# Patient Record
Sex: Male | Born: 1980 | Race: White | Hispanic: No | Marital: Married | Smoking: Never smoker
Health system: Southern US, Community
[De-identification: ages and names within clinical notes are randomized; demographics above are authoritative.]

## PROBLEM LIST (undated history)

## (undated) DIAGNOSIS — R030 Elevated blood-pressure reading, without diagnosis of hypertension: Secondary | ICD-10-CM

## (undated) DIAGNOSIS — B019 Varicella without complication: Secondary | ICD-10-CM

## (undated) HISTORY — DX: Elevated blood-pressure reading, without diagnosis of hypertension: R03.0

## (undated) HISTORY — DX: Varicella without complication: B01.9

---
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
## 2003-04-15 ENCOUNTER — Other Ambulatory Visit: Payer: Self-pay

## 2008-02-10 ENCOUNTER — Emergency Department: Payer: Self-pay | Admitting: Internal Medicine

## 2016-11-15 ENCOUNTER — Ambulatory Visit (INDEPENDENT_AMBULATORY_CARE_PROVIDER_SITE_OTHER): Payer: Managed Care, Other (non HMO) | Admitting: Primary Care

## 2016-11-15 ENCOUNTER — Encounter: Payer: Self-pay | Admitting: Primary Care

## 2016-11-15 VITALS — BP 120/98 | HR 70 | Temp 98.0°F | Ht 72.5 in | Wt 257.4 lb

## 2016-11-15 DIAGNOSIS — E785 Hyperlipidemia, unspecified: Secondary | ICD-10-CM | POA: Diagnosis not present

## 2016-11-15 DIAGNOSIS — R739 Hyperglycemia, unspecified: Secondary | ICD-10-CM | POA: Diagnosis not present

## 2016-11-15 DIAGNOSIS — R030 Elevated blood-pressure reading, without diagnosis of hypertension: Secondary | ICD-10-CM

## 2016-11-15 LAB — BASIC METABOLIC PANEL
BUN: 20 mg/dL (ref 6–23)
CO2: 27 mEq/L (ref 19–32)
Calcium: 9.6 mg/dL (ref 8.4–10.5)
Chloride: 104 mEq/L (ref 96–112)
Creatinine, Ser: 0.97 mg/dL (ref 0.40–1.50)
GFR: 93.56 mL/min (ref >60.00)
Glucose, Bld: 115 mg/dL — ABNORMAL HIGH (ref 70–99)
Potassium: 4.2 mEq/L (ref 3.5–5.1)
Sodium: 138 mEq/L (ref 135–145)

## 2016-11-15 LAB — HEMOGLOBIN A1C: Hgb A1c MFr Bld: 5.7 % (ref 4.6–6.5)

## 2016-11-15 NOTE — Assessment & Plan Note (Signed)
Diastolic reading above goal, even on recheck. Will have him start monitoring his blood pressure regularly, report readings at or above 135/90. Also work on weight loss through healthy diet and regular exercise. Will reevaluate in 3 months.

## 2016-11-15 NOTE — Assessment & Plan Note (Signed)
Fasting blood sugar of 114 on recent labs. A1c pending today. Discussed the importance of a healthy diet and regular exercise in order for weight loss, and to reduce the risk of other medical problems.

## 2016-11-15 NOTE — Patient Instructions (Signed)
Start monitoring your blood pressure, it should be between 120-130/70's-80's.   Complete lab work prior to leaving today. I will notify you of your results once received.   Start exercising. You should be getting 150 minutes of moderate intensity exercise weekly.  Increase consumption of vegetables, fruit, whole grains. Limit fast food.  Schedule a follow up visit in 3 months to recheck blood pressure and cholesterol. No eating for 8 hours prior to this visit. You may have water and black coffee.  It was a pleasure to meet you today! Please don't hesitate to call me with any questions. Welcome to [facility]!

## 2016-11-15 NOTE — Progress Notes (Signed)
   Subjective:    Patient ID: Ernest Schneider, male    DOB: 02-08-1981, 36 y.o.   MRN: [id number]  HPI  Ernest Schneider is a 36 year old male who presents today to establish care and discuss the problems mentioned below. Will obtain old records. Recently underwent biometric screening at work and has abnormal labs to discuss.  1) Hyperlipidemia: TC of 242, Trigs of 389, LDL of 119. Denies prior history of hyperlipidemia. He was fasting for these labs.  Diet currently consists of:  Breakfast: Skips Lunch: Packed lunch with sandwich; fast food mostly Dinner: Meat, vegetable, starch Snacks: Crackers, nuts Desserts: None Beverages: Water, Gatorade, little soda  Exercise: He does not currently exercise.    2) Hyperglycemia: Fasting blood sugar of 114. He denies prior history and family history of hyperglycemia and diabetes. He denies polyuria, polydipsia, polyphagia.  3) Elevated Blood Pressure Reading: BP in the office today of 122/98. He endorses an elevated reading at his pre-employee physical of 148/100. He does not routinely monitor his blood pressure. He denies chest pain, dizziness, headaches. He does have a family history of hypertension in his mother.  Review of Systems  Eyes: Negative for visual disturbance.  Respiratory: Negative for shortness of breath.   Cardiovascular: Negative for chest pain.  Endocrine: Negative for polydipsia, polyphagia and polyuria.  Neurological: Negative for dizziness and headaches.       Past Medical History:  Diagnosis Date  . Chickenpox   . Elevated blood pressure reading      Social History   Social History  . Marital status: Single    Spouse name: N/A  . Number of children: N/A  . Years of education: N/A   Occupational History  . Not on file.   Social History Main Topics  . Smoking status: Never Smoker  . Smokeless tobacco: Never Used  . Alcohol use Yes  . Drug use: Unknown  . Sexual activity: Not on file   Other Topics  Concern  . Not on file   Social History Narrative   Married.   3 children.   Works for the [employer].   Enjoys playing disc golf, riding 4-wheelers.     No past surgical history on file.  Family History  Problem Relation Age of Onset  . Depression Mother   . Heart attack Mother   . Miscarriages / [place] Mother   . Hypertension Mother   . Heart attack Paternal Grandfather     No Known Allergies  No current outpatient prescriptions on file prior to visit.   No current facility-administered medications on file prior to visit.     BP (!) 120/98   Pulse 70   Temp 98 F (36.7 C) (Oral)   Ht 6' 0.5" (1.842 m)   Wt 257 lb 6.4 oz (116.8 kg)   SpO2 98%   BMI 34.43 kg/m    Objective:   Physical Exam  Constitutional: He is oriented to person, place, and time. He appears well-nourished.  Neck: Neck supple.  Cardiovascular: Normal rate and regular rhythm.   Pulmonary/Chest: Effort normal and breath sounds normal. He has no wheezes. He has no rales.  Neurological: He is alert and oriented to person, place, and time.  Skin: Skin is warm and dry.  Psychiatric: He has a normal mood and affect.          Assessment & Plan:

## 2016-11-15 NOTE — Assessment & Plan Note (Signed)
Recent fasting labs above goal. Encouraged him to work on weight loss through healthy diet and regular exercise. We'll repeat lipids fasting in 3 months, this will give him time to work on weight loss.

## 2016-11-19 ENCOUNTER — Telehealth: Payer: Self-pay | Admitting: Primary Care

## 2016-11-19 NOTE — Telephone Encounter (Signed)
Patient was seen on 11/15/16.  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 Patient asked Jae Dire to send the office note to [employer] [facility]. The fax number is [phone]. Patient said they haven't received the office note. Patient was calling to make sure the note was faxed and if not, for it to be faxed.

## 2016-11-19 NOTE — Telephone Encounter (Signed)
Spoken to patient and I have notified patient I have faxed the letter to the [facility] [employer] [place] on 11/16/2016.  I will re-fax the letter again to [employer] [facility] [employer] [place] [place] Attn: Yehuda Budd, FNP-BC  Fax number is [phone]  Also mailing a copy of this letter for patient.

## 2018-12-12 ENCOUNTER — Emergency Department
Admission: EM | Admit: 2018-12-12 | Discharge: 2018-12-12 | Disposition: A | Discharge status: Home / Self Care | Payer: 59 | Attending: Emergency Medicine | Admitting: Emergency Medicine

## 2018-12-12 ENCOUNTER — Other Ambulatory Visit: Payer: Self-pay

## 2018-12-12 ENCOUNTER — Emergency Department: Payer: 59

## 2018-12-12 ENCOUNTER — Encounter: Payer: Self-pay | Admitting: Emergency Medicine

## 2018-12-12 DIAGNOSIS — R079 Chest pain, unspecified: Secondary | ICD-10-CM | POA: Diagnosis present

## 2018-12-12 DIAGNOSIS — R112 Nausea with vomiting, unspecified: Secondary | ICD-10-CM | POA: Insufficient documentation

## 2018-12-12 LAB — CBC
HCT: 46.1 % (ref 39.0–52.0)
Hemoglobin: 15.7 g/dL (ref 13.0–17.0)
MCH: 29 pg (ref 26.0–34.0)
MCHC: 34.1 g/dL (ref 30.0–36.0)
MCV: 85.2 fL (ref 80.0–100.0)
Platelets: 227 10*3/uL (ref 150–400)
RBC: 5.41 MIL/uL (ref 4.22–5.81)
RDW: 12.2 % (ref 11.5–15.5)
WBC: 12.3 10*3/uL — ABNORMAL HIGH (ref 4.0–10.5)
nRBC: 0 % (ref 0.0–0.2)

## 2018-12-12 LAB — BASIC METABOLIC PANEL
Anion gap: 9 (ref 5–15)
BUN: 17 mg/dL (ref 6–20)
CO2: 26 mmol/L (ref 22–32)
Calcium: 9.4 mg/dL (ref 8.9–10.3)
Chloride: 104 mmol/L (ref 98–111)
Creatinine, Ser: 1.05 mg/dL (ref 0.61–1.24)
GFR calc Af Amer: >60 mL/min (ref >60)
GFR calc non Af Amer: >60 mL/min (ref >60)
Glucose, Bld: 123 mg/dL — ABNORMAL HIGH (ref 70–99)
Potassium: 3.9 mmol/L (ref 3.5–5.1)
Sodium: 139 mmol/L (ref 135–145)

## 2018-12-12 LAB — TROPONIN I (HIGH SENSITIVITY)
Troponin I (High Sensitivity): 3 ng/L (ref <18)
Troponin I (High Sensitivity): 3 ng/L (ref <18)

## 2018-12-12 MED ORDER — ONDANSETRON 4 MG PO TBDP
4.0000 mg | ORAL_TABLET | Freq: Once | ORAL | Status: AC
  Administered 2018-12-12: 4 mg via ORAL
  Filled 2018-12-12: qty 1

## 2018-12-12 MED ORDER — ONDANSETRON 4 MG PO TBDP: 4.0000 mg | ORAL_TABLET | Freq: Three times a day (TID) | ORAL | 0 refills | Status: DC | PRN

## 2018-12-12 MED ORDER — FAMOTIDINE 20 MG PO TABS: 20.0000 mg | ORAL_TABLET | Freq: Two times a day (BID) | ORAL | 1 refills | Status: DC

## 2018-12-12 MED ORDER — FAMOTIDINE 20 MG PO TABS
20.0000 mg | ORAL_TABLET | Freq: Once | ORAL | Status: AC
  Administered 2018-12-12: 20 mg via ORAL
  Filled 2018-12-12: qty 1

## 2018-12-12 MED ORDER — SODIUM CHLORIDE 0.9% FLUSH: 3.0000 mL | Freq: Once | INTRAVENOUS | Status: DC

## 2018-12-12 NOTE — ED Provider Notes (Signed)
[facility] Emergency Department Provider Note       Time seen: ----------------------------------------- 11:34 AM on 12/12/2018 -----------------------------------------   I have reviewed the triage vital signs and the nursing notes.  HISTORY   Chief Complaint Chest Pain    HPI Ernest Schneider is a 38 y.o. male with a history of hypertension, hyperglycemia, hyperlipidemia who presents to the ED for intermittent chest pain since last night.  Patient also had some nausea and vomiting this morning.  Discomfort is 2 out of 10.  Past Medical History:  Diagnosis Date  . Chickenpox   . Elevated blood pressure reading     Patient Active Problem List   Diagnosis Date Noted  . Hyperglycemia 11/15/2016  . Hyperlipidemia 11/15/2016  . Elevated blood pressure reading 11/15/2016    History reviewed. No pertinent surgical history.  Allergies Patient has no known allergies.  Social History Social History   Tobacco Use  . Smoking status: Never Smoker  . Smokeless tobacco: Never Used  Substance Use Topics  . Alcohol use: Yes  . Drug use: Not on file   Review of Systems Constitutional: Negative for fever. Cardiovascular: Positive for chest pain Respiratory: Negative for shortness of breath. Gastrointestinal: Negative for abdominal pain, positive for nausea vomiting Musculoskeletal: Negative for back pain. Skin: Negative for rash. Neurological: Negative for headaches, focal weakness or numbness.  All systems negative/normal/unremarkable except as stated in the HPI  ____________________________________________   PHYSICAL EXAM:  VITAL SIGNS: ED Triage Vitals  Enc Vitals Group     BP 12/12/18 1019 (!) 145/96     Pulse Rate 12/12/18 1019 72     Resp 12/12/18 1019 20     Temp 12/12/18 1019 98 F (36.7 C)     Temp Source 12/12/18 1019 Oral     SpO2 12/12/18 1019 97 %     Weight 12/12/18 1017 255 lb (115.7 kg)     Height 12/12/18 1017 6\' 1"   (1.854 m)     Head Circumference --      Peak Flow --      Pain Score 12/12/18 1017 2     Pain Loc --      Pain Edu? --      Excl. in GC? --    Constitutional: Alert and oriented. Well appearing and in no distress. Eyes: Conjunctivae are normal. Normal extraocular movements. ENT      Head: Normocephalic and atraumatic.      Nose: No congestion/rhinnorhea.      Mouth/Throat: Mucous membranes are moist.      Neck: No stridor. Cardiovascular: Normal rate, regular rhythm. No murmurs, rubs, or gallops. Respiratory: Normal respiratory effort without tachypnea nor retractions. Breath sounds are clear and equal bilaterally. No wheezes/rales/rhonchi. Gastrointestinal: Soft and nontender. Normal bowel sounds Musculoskeletal: Nontender with normal range of motion in extremities. No lower extremity tenderness nor edema. Neurologic:  Normal speech and language. No gross focal neurologic deficits are appreciated.  Skin:  Skin is warm, dry and intact. No rash noted. Psychiatric: Mood and affect are normal. Speech and behavior are normal.  ____________________________________________  EKG: Interpreted by me.  Sinus rhythm with a rate of 77 bpm, left axis deviation, LVH, normal QT  ____________________________________________  ED COURSE:  As part of my medical decision making, I reviewed the following data within the electronic MEDICAL RECORD NUMBER History obtained from family if available, nursing notes, old chart and ekg, as well as notes from prior ED visits. Patient presented for chest pain,  we will assess with labs and imaging as indicated at this time.   Procedures  Ernest Schneider was evaluated in Emergency Department on 12/12/2018 for the symptoms described in the history of present illness. He was evaluated in the context of the global COVID-19 pandemic, which necessitated consideration that the patient might be at risk for infection with the SARS-CoV-2 virus that causes COVID-19.  Institutional protocols and algorithms that pertain to the evaluation of patients at risk for COVID-19 are in a state of rapid change based on information released by regulatory bodies including the CDC and federal and state organizations. These policies and algorithms were followed during the patient's care in the ED.  ____________________________________________   LABS (pertinent positives/negatives)  Labs Reviewed  BASIC METABOLIC PANEL - Abnormal; Notable for the following components:      Result Value   Glucose, Bld 123 (*)    All other components within normal limits  CBC - Abnormal; Notable for the following components:   WBC 12.3 (*)    All other components within normal limits  TROPONIN I (HIGH SENSITIVITY)    RADIOLOGY  Chest x-ray is normal  ____________________________________________   DIFFERENTIAL DIAGNOSIS   Musculoskeletal pain, GERD, anxiety, unstable angina, PE  FINAL ASSESSMENT AND PLAN  Chest pain   Plan: The patient had presented for nonspecific chest pain. Patient's labs revealed mild leukocytosis without other acute process. Patient's imaging was negative.  He appears medically cleared for outpatient follow-up, is low risk for ACS.   Laurence Aly, MD    Note: This note was generated in part or whole with voice recognition software. Voice recognition is usually quite accurate but there are transcription errors that can and very often do occur. I apologize for any typographical errors that were not detected and corrected.     Earleen Newport, MD 12/12/18 [phone]

## 2018-12-12 NOTE — ED Triage Notes (Signed)
Presents with intermittent chest pain since last pm also has had some n/v this am

## 2019-09-14 ENCOUNTER — Other Ambulatory Visit: Payer: Self-pay

## 2019-09-14 ENCOUNTER — Emergency Department: Payer: 59

## 2019-09-14 DIAGNOSIS — R0602 Shortness of breath: Secondary | ICD-10-CM | POA: Diagnosis not present

## 2019-09-14 DIAGNOSIS — U071 COVID-19: Secondary | ICD-10-CM | POA: Diagnosis not present

## 2019-09-14 DIAGNOSIS — J1282 Pneumonia due to coronavirus disease 2019: Secondary | ICD-10-CM | POA: Diagnosis present

## 2019-09-14 DIAGNOSIS — Z818 Family history of other mental and behavioral disorders: Secondary | ICD-10-CM

## 2019-09-14 DIAGNOSIS — J9601 Acute respiratory failure with hypoxia: Secondary | ICD-10-CM | POA: Diagnosis present

## 2019-09-14 DIAGNOSIS — Z8249 Family history of ischemic heart disease and other diseases of the circulatory system: Secondary | ICD-10-CM

## 2019-09-14 LAB — CBC WITH DIFFERENTIAL/PLATELET
Abs Immature Granulocytes: 0.02 10*3/uL (ref 0.00–0.07)
Basophils Absolute: 0 10*3/uL (ref 0.0–0.1)
Basophils Relative: 0 %
Eosinophils Absolute: 0 10*3/uL (ref 0.0–0.5)
Eosinophils Relative: 0 %
HCT: 42.7 % (ref 39.0–52.0)
Hemoglobin: 14.9 g/dL (ref 13.0–17.0)
Immature Granulocytes: 0 %
Lymphocytes Relative: 28 %
Lymphs Abs: 2.5 10*3/uL (ref 0.7–4.0)
MCH: 29.4 pg (ref 26.0–34.0)
MCHC: 34.9 g/dL (ref 30.0–36.0)
MCV: 84.4 fL (ref 80.0–100.0)
Monocytes Absolute: 0.5 10*3/uL (ref 0.1–1.0)
Monocytes Relative: 6 %
Neutro Abs: 5.8 10*3/uL (ref 1.7–7.7)
Neutrophils Relative %: 66 %
Platelets: 124 10*3/uL — ABNORMAL LOW (ref 150–400)
RBC: 5.06 MIL/uL (ref 4.22–5.81)
RDW: 12.1 % (ref 11.5–15.5)
WBC: 8.8 10*3/uL (ref 4.0–10.5)
nRBC: 0 % (ref 0.0–0.2)

## 2019-09-14 LAB — COMPREHENSIVE METABOLIC PANEL
ALT: 19 U/L (ref 0–44)
AST: 35 U/L (ref 15–41)
Albumin: 4 g/dL (ref 3.5–5.0)
Alkaline Phosphatase: 87 U/L (ref 38–126)
Anion gap: 15 (ref 5–15)
BUN: 11 mg/dL (ref 6–20)
CO2: 24 mmol/L (ref 22–32)
Calcium: 8.6 mg/dL — ABNORMAL LOW (ref 8.9–10.3)
Chloride: 97 mmol/L — ABNORMAL LOW (ref 98–111)
Creatinine, Ser: 1.21 mg/dL (ref 0.61–1.24)
GFR calc Af Amer: >60 mL/min (ref >60)
GFR calc non Af Amer: >60 mL/min (ref >60)
Glucose, Bld: 117 mg/dL — ABNORMAL HIGH (ref 70–99)
Potassium: 3.5 mmol/L (ref 3.5–5.1)
Sodium: 136 mmol/L (ref 135–145)
Total Bilirubin: 0.9 mg/dL (ref 0.3–1.2)
Total Protein: 7.9 g/dL (ref 6.5–8.1)

## 2019-09-14 LAB — PROTIME-INR
INR: 0.9 (ref 0.8–1.2)
Prothrombin Time: 12.2 seconds (ref 11.4–15.2)

## 2019-09-14 LAB — LACTIC ACID, PLASMA: Lactic Acid, Venous: 1.1 mmol/L (ref 0.5–1.9)

## 2019-09-14 MED ORDER — ACETAMINOPHEN 325 MG PO TABS
650.0000 mg | ORAL_TABLET | Freq: Once | ORAL | Status: AC
  Administered 2019-09-14: 650 mg via ORAL

## 2019-09-14 MED ORDER — SODIUM CHLORIDE 0.9 % IV BOLUS
1000.0000 mL | Freq: Once | INTRAVENOUS | Status: AC
  Administered 2019-09-14: 1000 mL via INTRAVENOUS

## 2019-09-14 NOTE — ED Notes (Signed)
Patient placed on 2L [place].

## 2019-09-14 NOTE — ED Triage Notes (Signed)
Patient dx COVID on Wednesday. Patient c/o SOB, fever, malaise.

## 2019-09-15 ENCOUNTER — Inpatient Hospital Stay
Admission: EM | Admit: 2019-09-15 | Discharge: 2019-09-21 | DRG: 177 | Disposition: A | Discharge status: Home / Self Care | Payer: 59 | Attending: Hospitalist | Admitting: Hospitalist

## 2019-09-15 ENCOUNTER — Emergency Department: Payer: 59

## 2019-09-15 ENCOUNTER — Encounter: Payer: Self-pay | Admitting: Internal Medicine

## 2019-09-15 DIAGNOSIS — J1282 Pneumonia due to coronavirus disease 2019: Secondary | ICD-10-CM | POA: Diagnosis present

## 2019-09-15 DIAGNOSIS — J189 Pneumonia, unspecified organism: Secondary | ICD-10-CM

## 2019-09-15 DIAGNOSIS — J9600 Acute respiratory failure, unspecified whether with hypoxia or hypercapnia: Secondary | ICD-10-CM | POA: Diagnosis not present

## 2019-09-15 DIAGNOSIS — U071 COVID-19: Secondary | ICD-10-CM | POA: Diagnosis present

## 2019-09-15 DIAGNOSIS — J9601 Acute respiratory failure with hypoxia: Secondary | ICD-10-CM | POA: Diagnosis present

## 2019-09-15 DIAGNOSIS — Z8249 Family history of ischemic heart disease and other diseases of the circulatory system: Secondary | ICD-10-CM | POA: Diagnosis not present

## 2019-09-15 DIAGNOSIS — R0602 Shortness of breath: Secondary | ICD-10-CM | POA: Diagnosis present

## 2019-09-15 DIAGNOSIS — J22 Unspecified acute lower respiratory infection: Secondary | ICD-10-CM

## 2019-09-15 DIAGNOSIS — Z818 Family history of other mental and behavioral disorders: Secondary | ICD-10-CM | POA: Diagnosis not present

## 2019-09-15 DIAGNOSIS — A419 Sepsis, unspecified organism: Secondary | ICD-10-CM

## 2019-09-15 LAB — CREATININE, SERUM
Creatinine, Ser: 1.02 mg/dL (ref 0.61–1.24)
GFR calc Af Amer: >60 mL/min (ref >60)
GFR calc non Af Amer: >60 mL/min (ref >60)

## 2019-09-15 LAB — PROCALCITONIN: Procalcitonin: 0.24 ng/mL

## 2019-09-15 LAB — URINALYSIS, COMPLETE (UACMP) WITH MICROSCOPIC
Bacteria, UA: NONE SEEN
Bilirubin Urine: NEGATIVE
Glucose, UA: 50 mg/dL — AB
Hgb urine dipstick: NEGATIVE
Ketones, ur: 20 mg/dL — AB
Leukocytes,Ua: NEGATIVE
Nitrite: NEGATIVE
Protein, ur: 100 mg/dL — AB
Specific Gravity, Urine: 1.031 — ABNORMAL HIGH (ref 1.005–1.030)
pH: 6 (ref 5.0–8.0)

## 2019-09-15 LAB — TROPONIN I (HIGH SENSITIVITY): Troponin I (High Sensitivity): 17 ng/L (ref <18)

## 2019-09-15 LAB — CBC
HCT: 42 % (ref 39.0–52.0)
Hemoglobin: 14.4 g/dL (ref 13.0–17.0)
MCH: 29.3 pg (ref 26.0–34.0)
MCHC: 34.3 g/dL (ref 30.0–36.0)
MCV: 85.4 fL (ref 80.0–100.0)
Platelets: 123 10*3/uL — ABNORMAL LOW (ref 150–400)
RBC: 4.92 MIL/uL (ref 4.22–5.81)
RDW: 12.4 % (ref 11.5–15.5)
WBC: 5.1 10*3/uL (ref 4.0–10.5)
nRBC: 0 % (ref 0.0–0.2)

## 2019-09-15 LAB — BRAIN NATRIURETIC PEPTIDE: B Natriuretic Peptide: 30.6 pg/mL (ref 0.0–100.0)

## 2019-09-15 LAB — ABO/RH: ABO/RH(D): O POS

## 2019-09-15 LAB — FIBRIN DERIVATIVES D-DIMER (ARMC ONLY): Fibrin derivatives D-dimer (ARMC): 720.35 ng/mL (FEU) — ABNORMAL HIGH (ref 0.00–499.00)

## 2019-09-15 LAB — LACTATE DEHYDROGENASE: LDH: 234 U/L — ABNORMAL HIGH (ref 98–192)

## 2019-09-15 LAB — C-REACTIVE PROTEIN: CRP: 11 mg/dL — ABNORMAL HIGH (ref <1.0)

## 2019-09-15 LAB — FERRITIN: Ferritin: 623 ng/mL — ABNORMAL HIGH (ref 24–336)

## 2019-09-15 LAB — TRIGLYCERIDES: Triglycerides: 118 mg/dL (ref <150)

## 2019-09-15 LAB — FIBRINOGEN: Fibrinogen: 555 mg/dL — ABNORMAL HIGH (ref 210–475)

## 2019-09-15 MED ORDER — ONDANSETRON HCL 4 MG PO TABS: 4.0000 mg | ORAL_TABLET | Freq: Four times a day (QID) | ORAL | Status: DC | PRN

## 2019-09-15 MED ORDER — SODIUM CHLORIDE 0.9% FLUSH: 3.0000 mL | INTRAVENOUS | Status: DC | PRN

## 2019-09-15 MED ORDER — DEXAMETHASONE SODIUM PHOSPHATE 10 MG/ML IJ SOLN
10.0000 mg | Freq: Once | INTRAMUSCULAR | Status: AC
  Administered 2019-09-15: 10 mg via INTRAVENOUS
  Filled 2019-09-15: qty 1

## 2019-09-15 MED ORDER — GUAIFENESIN-DM 100-10 MG/5ML PO SYRP
10.0000 mL | ORAL_SOLUTION | ORAL | Status: DC | PRN
  Administered 2019-09-16 – 2019-09-18 (×6): 10 mL via ORAL
  Filled 2019-09-15 (×8): qty 10

## 2019-09-15 MED ORDER — HYDROCOD POLST-CPM POLST ER 10-8 MG/5ML PO SUER
5.0000 mL | Freq: Two times a day (BID) | ORAL | Status: DC | PRN
  Administered 2019-09-18 – 2019-09-19 (×2): 5 mL via ORAL
  Filled 2019-09-15 (×3): qty 5

## 2019-09-15 MED ORDER — ALBUTEROL SULFATE HFA 108 (90 BASE) MCG/ACT IN AERS
2.0000 | INHALATION_SPRAY | Freq: Four times a day (QID) | RESPIRATORY_TRACT | Status: DC
  Administered 2019-09-15 – 2019-09-21 (×21): 2 via RESPIRATORY_TRACT
  Filled 2019-09-15: qty 6.7

## 2019-09-15 MED ORDER — SODIUM CHLORIDE 0.9 % IV SOLN
100.0000 mg | Freq: Every day | INTRAVENOUS | Status: AC
  Administered 2019-09-16 – 2019-09-19 (×4): 100 mg via INTRAVENOUS
  Filled 2019-09-15 (×4): qty 100

## 2019-09-15 MED ORDER — ONDANSETRON HCL 4 MG/2ML IJ SOLN: 4.0000 mg | Freq: Four times a day (QID) | INTRAMUSCULAR | Status: DC | PRN

## 2019-09-15 MED ORDER — ASCORBIC ACID 500 MG PO TABS
500.0000 mg | ORAL_TABLET | Freq: Every day | ORAL | Status: DC
  Administered 2019-09-15 – 2019-09-21 (×5): 500 mg via ORAL
  Filled 2019-09-15 (×6): qty 1

## 2019-09-15 MED ORDER — SODIUM CHLORIDE 0.9 % IV SOLN
200.0000 mg | Freq: Once | INTRAVENOUS | Status: AC
  Administered 2019-09-15: 10:00:00 200 mg via INTRAVENOUS
  Filled 2019-09-15: qty 40

## 2019-09-15 MED ORDER — SODIUM CHLORIDE 0.9 % IV SOLN
250.0000 mL | INTRAVENOUS | Status: DC | PRN
  Administered 2019-09-16 – 2019-09-17 (×2): 250 mL via INTRAVENOUS

## 2019-09-15 MED ORDER — ENOXAPARIN SODIUM 40 MG/0.4ML ~~LOC~~ SOLN
40.0000 mg | SUBCUTANEOUS | Status: DC
  Administered 2019-09-15 – 2019-09-21 (×7): 40 mg via SUBCUTANEOUS
  Filled 2019-09-15 (×7): qty 0.4

## 2019-09-15 MED ORDER — IBUPROFEN 100 MG/5ML PO SUSP: 800.0000 mg | Freq: Once | ORAL | Status: DC

## 2019-09-15 MED ORDER — ORAL CARE MOUTH RINSE
15.0000 mL | Freq: Two times a day (BID) | OROMUCOSAL | Status: DC
  Administered 2019-09-15 – 2019-09-21 (×11): 15 mL via OROMUCOSAL

## 2019-09-15 MED ORDER — DEXAMETHASONE SODIUM PHOSPHATE 10 MG/ML IJ SOLN
6.0000 mg | INTRAMUSCULAR | Status: DC
  Administered 2019-09-16: 09:00:00 6 mg via INTRAVENOUS
  Filled 2019-09-15: qty 1

## 2019-09-15 MED ORDER — IBUPROFEN 800 MG PO TABS
800.0000 mg | ORAL_TABLET | Freq: Once | ORAL | Status: AC
  Administered 2019-09-15: 800 mg via ORAL

## 2019-09-15 MED ORDER — ZINC SULFATE 220 (50 ZN) MG PO CAPS
220.0000 mg | ORAL_CAPSULE | Freq: Every day | ORAL | Status: DC
  Administered 2019-09-15 – 2019-09-21 (×5): 220 mg via ORAL
  Filled 2019-09-15 (×6): qty 1

## 2019-09-15 MED ORDER — ACETAMINOPHEN 325 MG PO TABS
650.0000 mg | ORAL_TABLET | Freq: Four times a day (QID) | ORAL | Status: DC | PRN
  Administered 2019-09-15 – 2019-09-17 (×6): 650 mg via ORAL
  Filled 2019-09-15 (×6): qty 2

## 2019-09-15 MED ORDER — SODIUM CHLORIDE 0.9% FLUSH
3.0000 mL | Freq: Two times a day (BID) | INTRAVENOUS | Status: DC
  Administered 2019-09-15 – 2019-09-21 (×12): 3 mL via INTRAVENOUS

## 2019-09-15 MED ORDER — ADULT MULTIVITAMIN W/MINERALS CH
1.0000 | ORAL_TABLET | Freq: Every day | ORAL | Status: DC
  Administered 2019-09-15 – 2019-09-21 (×5): 1 via ORAL
  Filled 2019-09-15 (×6): qty 1

## 2019-09-15 NOTE — ED Notes (Signed)
ED Provider at bedside. 

## 2019-09-15 NOTE — ED Provider Notes (Signed)
[facility] Emergency Department Provider Note  ____________________________________________   First MD Initiated Contact with Patient 09/15/19 [phone]     (approximate)  I have reviewed the triage vital signs and the nursing notes.   HISTORY  Chief Complaint Fever    HPI Ernest Schneider is a 39 y.o. male with no chronic medical history other than obesity who presents for evaluation of about 5 days of worsening fever, shortness of breath, cough, generalized body aches.  He went to an urgent care about 4 days ago and had testing performed including COVID-19.  He tested positive and received the results 3 days ago.  Since that time his symptoms have steadily gotten worse.  He reports that any amount of exertion makes the shortness of breath and cough worse.  He says he has not been able to control well his fever even with over-the-counter remedies.  He has not had much appetite and does not have much of a sense of smell or taste so he is not eating or drinking well.  Nothing in particular makes his symptoms better and they are severe.  He has some chest pain when he coughs but only at that time.  He has no smoking history.  He did not receive the Covid vaccination.         Past Medical History:  Diagnosis Date  . Chickenpox   . Elevated blood pressure reading     Patient Active Problem List   Diagnosis Date Noted  . Pneumonia due to COVID-19 virus 09/15/2019  . Acute respiratory failure due to COVID-19 (HCC) 09/15/2019  . Hyperglycemia 11/15/2016  . Hyperlipidemia 11/15/2016  . Elevated blood pressure reading 11/15/2016    History reviewed. No pertinent surgical history.  Prior to Admission medications   Medication Sig Start Date End Date Taking? Authorizing Provider  famotidine (PEPCID) 20 MG tablet Take 1 tablet (20 mg total) by mouth 2 (two) times daily. 12/12/18   Emily Filbert, MD  ondansetron (ZOFRAN ODT) 4 MG disintegrating tablet Take 1  tablet (4 mg total) by mouth every 8 (eight) hours as needed for nausea or vomiting. 12/12/18   Emily Filbert, MD    Allergies Patient has no known allergies.  Family History  Problem Relation Age of Onset  . Depression Mother   . Heart attack Mother   . Miscarriages / [place] Mother   . Hypertension Mother   . Heart attack Paternal Grandfather     Social History Social History   Tobacco Use  . Smoking status: Never Smoker  . Smokeless tobacco: Never Used  Substance Use Topics  . Alcohol use: Yes  . Drug use: Not on file    Review of Systems Constitutional: +fever/chills, general malaise Eyes: No visual changes. ENT: +sore throat. Cardiovascular: Denies chest pain. Respiratory: + Shortness of breath and cough. Gastrointestinal: No abdominal pain.  No nausea, no vomiting.  No diarrhea.  No constipation. Genitourinary: Negative for dysuria. Musculoskeletal: Negative for neck pain.  Negative for back pain. Integumentary: Negative for rash. Neurological: Negative for headaches, focal weakness or numbness.   ____________________________________________   PHYSICAL EXAM:  VITAL SIGNS: ED Triage Vitals  Enc Vitals Group     BP 09/14/19 2252 (!) 156/102     Pulse Rate 09/14/19 2252 (!) 115     Resp 09/14/19 2252 (!) 24     Temp 09/14/19 2252 (!) 102.9 F (39.4 C)     Temp Source 09/15/19 0334 Oral  SpO2 09/14/19 2252 93 %     Weight 09/14/19 2252 (!) 115.7 kg (255 lb 1.2 oz)     Height 09/14/19 2252 1.854 m (6\' 1" )     Head Circumference --      Peak Flow --      Pain Score 09/14/19 2252 10     Pain Loc --      Pain Edu? --      Excl. in GC? --     Constitutional: Alert and oriented.  Eyes: Conjunctivae are normal.  Head: Atraumatic. Nose: No congestion/rhinnorhea. Mouth/Throat: Patient is wearing a mask. Neck: No stridor.  No meningeal signs.   Cardiovascular: Tachycardia, regular rhythm. Good peripheral circulation. Grossly normal heart  sounds. Respiratory: Increased respiratory rate.  No audible wheezing.  Frequent dry cough. Gastrointestinal: Soft and nontender. No distention.  Musculoskeletal: No lower extremity tenderness nor edema. No gross deformities of extremities. Neurologic:  Normal speech and language. No gross focal neurologic deficits are appreciated.  Skin:  Skin is warm, dry and intact. Psychiatric: Mood and affect are normal. Speech and behavior are normal.  ____________________________________________   LABS (all labs ordered are listed, but only abnormal results are displayed)  Labs Reviewed  COMPREHENSIVE METABOLIC PANEL - Abnormal; Notable for the following components:      Result Value   Chloride 97 (*)    Glucose, Bld 117 (*)    Calcium 8.6 (*)    All other components within normal limits  CBC WITH DIFFERENTIAL/PLATELET - Abnormal; Notable for the following components:   Platelets 124 (*)    All other components within normal limits  CULTURE, BLOOD (ROUTINE X 2)  CULTURE, BLOOD (ROUTINE X 2)  LACTIC ACID, PLASMA  PROTIME-INR  URINALYSIS, COMPLETE (UACMP) WITH MICROSCOPIC  FIBRIN DERIVATIVES D-DIMER ([facility] ONLY)  PROCALCITONIN  LACTATE DEHYDROGENASE  FERRITIN  TRIGLYCERIDES  FIBRINOGEN  C-REACTIVE PROTEIN  BRAIN NATRIURETIC PEPTIDE  TROPONIN I (HIGH SENSITIVITY)   ____________________________________________  EKG  ED ECG REPORT I, 09/16/19, the attending physician, personally viewed and interpreted this ECG.  Date: 09/14/2019 EKG Time: 22: 54 Rate: 102 Rhythm: Borderline sinus tachycardia QRS Axis: Left axis deviation Intervals: normal ST/T Wave abnormalities: normal Narrative Interpretation: no evidence of acute ischemia  ____________________________________________  RADIOLOGY I, 09/16/2019, personally viewed and evaluated these images (plain radiographs) as part of my medical decision making, as well as reviewing the written report by the radiologist.  ED MD  interpretation: Consistent with multifocal pneumonia  Official radiology report(s): DG Chest 1 View  Result Date: 09/15/2019 CLINICAL DATA:  COVID pneumonia EXAM: CHEST  1 VIEW COMPARISON:  12/12/2018 FINDINGS: There is scattered bilateral pulmonary opacities, greatest in the right mid lung zone. There is no pneumothorax. No large pleural effusion. The lung volumes are low. The cardiac silhouette is borderline enlarged. There is no acute osseous abnormality. IMPRESSION: Bilateral pulmonary opacities concerning for multifocal pneumonia (viral or bacterial). Electronically Signed   By: 12/14/2018 M.D.   On: 09/15/2019 03:44    ____________________________________________   PROCEDURES   Procedure(s) performed (including Critical Care):  .1-3 Lead EKG Interpretation Performed by: 09/17/2019, MD Authorized by: Loleta Rose, MD     Interpretation: abnormal     ECG rate:  105   ECG rate assessment: tachycardic     Rhythm: sinus tachycardia     Ectopy: none     Conduction: normal    .Critical Care Performed by: Loleta Rose, MD Authorized by: Loleta Rose, MD   Critical care  provider statement:    Critical care time (minutes):  30   Critical care time was exclusive of:  Separately billable procedures and treating other patients   Critical care was necessary to treat or prevent imminent or life-threatening deterioration of the following conditions:  Respiratory failure   Critical care was time spent personally by me on the following activities:  Development of treatment plan with patient or surrogate, discussions with consultants, evaluation of patient's response to treatment, examination of patient, obtaining history from patient or surrogate, ordering and performing treatments and interventions, ordering and review of laboratory studies, ordering and review of radiographic studies, pulse oximetry, re-evaluation of patient's condition and review of old  charts     ____________________________________________   INITIAL IMPRESSION / MDM / ASSESSMENT AND PLAN / ED COURSE  As part of my medical decision making, I reviewed the following data within the electronic MEDICAL RECORD NUMBER Nursing notes reviewed and incorporated, Labs reviewed , EKG interpreted , Old chart reviewed, Radiograph reviewed , Discussed with admitting physician (Dr. Para Marchuncan) and Notes from prior ED visits   Differential diagnosis includes, but is not limited to, COVID-19 lower respiratory tract infection, community-acquired pneumonia, PE.  The patient is on the cardiac monitor to evaluate for evidence of arrhythmia and/or significant heart rate changes.  The patient tested positive as an outpatient which I verified in the computer system ([employer]).  I reviewed his chest x-ray and he has multifocal pneumonia consistent with a COVID-19 infection.  He has mildly tachycardic and febrile which is also consistent in addition to some tachypnea.  His CBC is normal, comprehensive metabolic panel is normal, and a normal lactic acid.  The patient has oxygen saturation of anywhere between 89 and 98%.  His oxygenation seems to improve after cough.  I discussed it with the patient and we will try and ambulatory SPO2.  If he demonstrates that he needs oxygen when ambulating I will admit him to the hospital.  Otherwise I will treat him with prescriptions to help with his symptoms and refer him to the antibiotic infusion center.  He understands and agrees with the plan.  He received Tylenol and I ordered a dose of ibuprofen 600 mg by mouth as well.  He is able to tolerate oral fluids as per COVID recommendations I am not providing IV fluids at this time.     Clinical Course as of Sep 14 552  Sat Sep 15, 2019  0452 DG Chest 1 View [CF]  [id number] The patient dropped down to 87% with ambulation and his respiratory rate went up to the upper 40s and he had to stop multiple times due to the severity of  his cough.  I believe he would benefit from inpatient treatment.He is on 2 L of oxygen by nasal cannula.  I added on all of the COVID-19 inflammatory markers.  I ordered Decadron 10 mg IV and remdesivir per pharmacy consult.  I have consulted the hospitalist for admission.   [CF]  [id number] Discussed case with Dr. Para Marchuncan with the hospitalist service who will admit.   [CF]    Clinical Course User Index [CF] Loleta RoseForbach, Zedric Deroy, MD     ____________________________________________  FINAL CLINICAL IMPRESSION(S) / ED DIAGNOSES  Final diagnoses:  Lower respiratory tract infection due to COVID-19 virus  Acute respiratory failure with hypoxemia (HCC)  Multifocal pneumonia     MEDICATIONS GIVEN DURING THIS VISIT:  Medications  dexamethasone (DECADRON) injection 10 mg (has no administration in time range)  acetaminophen (TYLENOL)  tablet 650 mg (650 mg Oral Given 09/14/19 2303)  sodium chloride 0.9 % bolus 1,000 mL (0 mLs Intravenous Stopped 09/15/19 0331)  ibuprofen (ADVIL) tablet 800 mg (800 mg Oral Given 09/15/19 6962)     ED Discharge Orders    None      *Please note:  JONDAVID SCHREIER was evaluated in Emergency Department on 09/15/2019 for the symptoms described in the history of present illness. He was evaluated in the context of the global COVID-19 pandemic, which necessitated consideration that the patient might be at risk for infection with the SARS-CoV-2 virus that causes COVID-19. Institutional protocols and algorithms that pertain to the evaluation of patients at risk for COVID-19 are in a state of rapid change based on information released by regulatory bodies including the CDC and federal and state organizations. These policies and algorithms were followed during the patient's care in the ED.  Some ED evaluations and interventions may be delayed as a result of limited staffing during and after the pandemic.*  Note:  This document was prepared using Dragon voice recognition software and may  include unintentional dictation errors.   Loleta Rose, MD 09/15/19 [phone]

## 2019-09-15 NOTE — ED Notes (Signed)
Attempted to call report. Nurse Secretary for 1C stated that the receiving RN was in a patients room and would call me back.

## 2019-09-15 NOTE — H&P (Addendum)
History and Physical:    Ernest Schneider   [name] DOB: 06/04/1980 DOA: 09/15/2019  Referring MD/provider: Dr. York Cerise PCP: Patient, No Pcp Per   Patient coming from: Home  Chief Complaint: Malaise, anorexia, shortness of breath and fevers  History of Present Illness:   Ernest Schneider is an 39 y.o. male on no chronic medications who was in his usual state of health until 5 days prior to admission when he developed onset of fevers, malaise and anorexia.  Patient notes he was lying in bed and did not have the energy to get out.  Notes that he had no hunger at all neither for liquids nor solids, has lost his sense of taste and smell.  4 days ago patient was seen in urgent care and tested positive for Covid.  Patient is not vaccinated against SARS-CoV-2.  2 days ago patient developed shortness of breath both at rest and particularly with exertion.  Patient had been reading on the computer that if you develop shortness of breath or did not eat or drink for a while you should come to the ED so patient came to the ED.  Review of systems is negative for dizziness but positive for profound fatigue as noted earlier.  No syncope or presyncope.  Patient has developed some diarrhea for the past 2 days, 3-4 times a day loose stools not watery.  He vomited twice yesterday.  He has not had good p.o. intake for the past 3 days.  Patient admits that he has a cough when he takes a deep breath.  Not productive.  No chest pain.  Patient denies any tobacco use.  Denies marijuana use.  Notes he has 5-6 beers a day only on the weekends.  States he does not drink during the day.  Denies any history of complicated withdrawal.  ED Course: Chest x-ray revealed bilateral pulmonary opacities concerning for multifocal pneumonia.  The patient was noted to be minimally hypoxic at rest with 89% O2 sats however when they tried to ambulate him his O2 sats dropped to 87% on room air with significantly increased  respiratory rate and tachycardia.  Patient is now called in for admission for treatment of Covid pneumonia.  Patient was started on remdesivir and steroids.  ROS:   ROS   Review of Systems: As per HPI  Past Medical History:   Past Medical History:  Diagnosis Date  . Chickenpox   . Elevated blood pressure reading     Past Surgical History:   History reviewed. No pertinent surgical history.  Social History:   Social History   Socioeconomic History  . Marital status: Married    Spouse name: Not on file  . Number of children: Not on file  . Years of education: Not on file  . Highest education level: Not on file  Occupational History  . Not on file  Tobacco Use  . Smoking status: Never Smoker  . Smokeless tobacco: Never Used  Substance and Sexual Activity  . Alcohol use: Yes  . Drug use: Not on file  . Sexual activity: Not on file  Other Topics Concern  . Not on file  Social History Narrative   Married.   3 children.   Works for the [employer].   Enjoys playing disc golf, riding 4-wheelers.    Social Determinants of Health   Financial Resource Strain:   . Difficulty of Paying Living Expenses:   Food Insecurity:   . Worried About  Running Out of Food in the Last Year:   . Ran Out of Food in the Last Year:   Transportation Needs:   . Lack of Transportation (Medical):   Marland Kitchen Lack of Transportation (Non-Medical):   Physical Activity:   . Days of Exercise per Week:   . Minutes of Exercise per Session:   Stress:   . Feeling of Stress :   Social Connections:   . Frequency of Communication with Friends and Family:   . Frequency of Social Gatherings with Friends and Family:   . Attends Religious Services:   . Active Member of Clubs or Organizations:   . Attends Banker Meetings:   Marland Kitchen Marital Status:   Intimate Partner Violence:   . Fear of Current or Ex-Partner:   . Emotionally Abused:   Marland Kitchen Physically Abused:   . Sexually Abused:      Allergies   Patient has no known allergies.  Family history:   Family History  Problem Relation Age of Onset  . Depression Mother   . Heart attack Mother   . Miscarriages / [place] Mother   . Hypertension Mother   . Heart attack Paternal Grandfather     Current Medications:   Prior to Admission medications   Medication Sig Start Date End Date Taking? Authorizing Provider  Aspirin-Acetaminophen-Caffeine (GOODYS EXTRA STRENGTH) [phone] MG PACK Take 1 packet by mouth every 6 (six) hours as needed.   Yes [provider]  ondansetron (ZOFRAN) 4 MG tablet Take 4 mg by mouth every 8 (eight) hours as needed for nausea/vomiting. 09/11/19  Yes [provider]  Phenyleph-Doxylamine-DM-APAP (ALKA-SELTZER PLUS COLD & FLU) 10-12.5-20-650 MG PACK Take by mouth.   Yes [provider]    Physical Exam:   Vitals:   09/15/19 0700 09/15/19 0743 09/15/19 0800 09/15/19 0830  BP: (!) 134/91 (!) 129/86 (!) 123/95 (!) 124/87  Pulse: 75 74 72 70  Resp: 20 23 22 22   Temp:  99 F (37.2 C)    TempSrc:  Oral    SpO2: 95% 95% 94% 96%  Weight:      Height:         Physical Exam: Blood pressure (!) 124/87, pulse 70, temperature 99 F (37.2 C), temperature source Oral, resp. rate 22, height 6\' 1"  (1.854 m), weight (!) 115.7 kg, SpO2 96 %. Gen: Obese man lying flat in bed in no acute distress, mild unlabored tachypnea. Eyes: sclera anicteric, conjuctiva mildly injected bilaterally CVS: S1-S2, regulary, no gallops Respiratory: Lungs with reasonable air entry bilaterally and no adventitious sounds. GI: NABS, soft, NT  LE: No edema. No cyanosis Neuro: A/O x 3, Moving all extremities equally with normal strength, CN 3-12 intact, grossly nonfocal.  Psych: patient is logical and coherent, mood and affect appropriate to situation. Skin: no rashes or lesions or ulcers,    Data Review:    Labs: Basic Metabolic Panel: Recent Labs  Lab 09/14/19 2259  NA 136  K  3.5  CL 97*  CO2 24  GLUCOSE 117*  BUN 11  CREATININE 1.21  CALCIUM 8.6*   Liver Function Tests: Recent Labs  Lab 09/14/19 2259  AST 35  ALT 19  ALKPHOS 87  BILITOT 0.9  PROT 7.9  ALBUMIN 4.0   No results for input(s): LIPASE, AMYLASE in the last 168 hours. No results for input(s): AMMONIA in the last 168 hours. CBC: Recent Labs  Lab 09/14/19 2259  WBC 8.8  NEUTROABS 5.8  HGB 14.9  HCT 42.7  MCV 84.4  PLT 124*   Cardiac Enzymes: No results for input(s): CKTOTAL, CKMB, CKMBINDEX, TROPONINI in the last 168 hours.  BNP (last 3 results) No results for input(s): PROBNP in the last 8760 hours. CBG: No results for input(s): GLUCAP in the last 168 hours.  Urinalysis No results found for: COLORURINE, APPEARANCEUR, LABSPEC, PHURINE, GLUCOSEU, HGBUR, BILIRUBINUR, KETONESUR, PROTEINUR, UROBILINOGEN, NITRITE, LEUKOCYTESUR    Radiographic Studies: DG Chest 1 View  Result Date: 09/15/2019 CLINICAL DATA:  COVID pneumonia EXAM: CHEST  1 VIEW COMPARISON:  12/12/2018 FINDINGS: There is scattered bilateral pulmonary opacities, greatest in the right mid lung zone. There is no pneumothorax. No large pleural effusion. The lung volumes are low. The cardiac silhouette is borderline enlarged. There is no acute osseous abnormality. IMPRESSION: Bilateral pulmonary opacities concerning for multifocal pneumonia (viral or bacterial). Electronically Signed   By: Katherine Mantle M.D.   On: 09/15/2019 03:44    EKG: Independently reviewed.  Sinus rhythm at 100.  Positive LAD.  Relatively flattened T waves in the limb leads and in V6.  No acute ST-T wave changes.   Assessment/Plan:   Principal Problem:   Acute respiratory failure due to COVID-19 [facility]) Active Problems:   Pneumonia due to COVID-19 virus  Pneumonia secondary to COVID-19 Patient is to be admitted with appropriate precautions in place Oxygen as needed Procalcitonin is 0.24 today, will follow but will not initiate  antibiotics at present. Would have low threshold for starting antibiotics if patient develops higher fever or has clinical worsening. CRP is pending. Continue remdesivir and steroids as initiated in the ED.  Encourage the patient to sit up in chair in the daytime use I-S and flutter valve for pulmonary toiletry and then prone in bed when at night.   Will advance activity and titrate down oxygen as possible.  Actemra off label use - patient was told that if COVID-19 pneumonitis gets worse we might potentially use Actemra off label, patient denies any known history of active diverticulitis, tuberculosis or hepatitis, understands the risks and benefits and wants to proceed with Actemra treatment if required.    Other information:   DVT prophylaxis: Lovenox ordered. Code Status: Full Family Communication: Patient states his family knows he is here Disposition Plan: Home Consults called: None Admission status: Inpatient  Genise Strack Tublu Marquita Lias [facility]  If 7PM-7AM, please contact night-coverage [email or web address] Password [facility] 09/15/2019, 9:24 AM

## 2019-09-15 NOTE — ED Notes (Signed)
Patient ambulated with pulse oximetry monitor per MD request. Patient's oxygen saturation level decreased to 87% on RA. Patient's respiratory rate increased to 38-41 breaths per minute. Patient's heart rate increased to 113-119 beats per minute. Patient had 3 separate episodes of coughing.   Patient placed back in bed. Patient placed on 2L . MD informed. RN will continue to monitor.

## 2019-09-16 LAB — FERRITIN: Ferritin: 751 ng/mL — ABNORMAL HIGH (ref 24–336)

## 2019-09-16 LAB — COMPREHENSIVE METABOLIC PANEL
ALT: 21 U/L (ref 0–44)
AST: 47 U/L — ABNORMAL HIGH (ref 15–41)
Albumin: 3.5 g/dL (ref 3.5–5.0)
Alkaline Phosphatase: 68 U/L (ref 38–126)
Anion gap: 10 (ref 5–15)
BUN: 15 mg/dL (ref 6–20)
CO2: 27 mmol/L (ref 22–32)
Calcium: 8.6 mg/dL — ABNORMAL LOW (ref 8.9–10.3)
Chloride: 100 mmol/L (ref 98–111)
Creatinine, Ser: 1.19 mg/dL (ref 0.61–1.24)
GFR calc Af Amer: >60 mL/min (ref >60)
GFR calc non Af Amer: >60 mL/min (ref >60)
Glucose, Bld: 127 mg/dL — ABNORMAL HIGH (ref 70–99)
Potassium: 3.7 mmol/L (ref 3.5–5.1)
Sodium: 137 mmol/L (ref 135–145)
Total Bilirubin: 0.8 mg/dL (ref 0.3–1.2)
Total Protein: 7.1 g/dL (ref 6.5–8.1)

## 2019-09-16 LAB — CBC WITH DIFFERENTIAL/PLATELET
Abs Immature Granulocytes: 0.03 10*3/uL (ref 0.00–0.07)
Basophils Absolute: 0 10*3/uL (ref 0.0–0.1)
Basophils Relative: 0 %
Eosinophils Absolute: 0 10*3/uL (ref 0.0–0.5)
Eosinophils Relative: 0 %
HCT: 40.1 % (ref 39.0–52.0)
Hemoglobin: 13.8 g/dL (ref 13.0–17.0)
Immature Granulocytes: 0 %
Lymphocytes Relative: 24 %
Lymphs Abs: 2.4 10*3/uL (ref 0.7–4.0)
MCH: 29.5 pg (ref 26.0–34.0)
MCHC: 34.4 g/dL (ref 30.0–36.0)
MCV: 85.7 fL (ref 80.0–100.0)
Monocytes Absolute: 0.6 10*3/uL (ref 0.1–1.0)
Monocytes Relative: 6 %
Neutro Abs: 7 10*3/uL (ref 1.7–7.7)
Neutrophils Relative %: 70 %
Platelets: 143 10*3/uL — ABNORMAL LOW (ref 150–400)
RBC: 4.68 MIL/uL (ref 4.22–5.81)
RDW: 12.3 % (ref 11.5–15.5)
WBC: 10.1 10*3/uL (ref 4.0–10.5)
nRBC: 0 % (ref 0.0–0.2)

## 2019-09-16 LAB — PHOSPHORUS: Phosphorus: 2.5 mg/dL (ref 2.5–4.6)

## 2019-09-16 LAB — HIV ANTIBODY (ROUTINE TESTING W REFLEX): HIV Screen 4th Generation wRfx: NONREACTIVE

## 2019-09-16 LAB — FIBRIN DERIVATIVES D-DIMER (ARMC ONLY): Fibrin derivatives D-dimer (ARMC): 747.67 ng/mL (FEU) — ABNORMAL HIGH (ref 0.00–499.00)

## 2019-09-16 LAB — MAGNESIUM: Magnesium: 1.9 mg/dL (ref 1.7–2.4)

## 2019-09-16 LAB — C-REACTIVE PROTEIN: CRP: 9.2 mg/dL — ABNORMAL HIGH (ref <1.0)

## 2019-09-16 MED ORDER — IBUPROFEN 400 MG PO TABS
400.0000 mg | ORAL_TABLET | Freq: Four times a day (QID) | ORAL | Status: DC | PRN
  Administered 2019-09-16: 400 mg via ORAL
  Filled 2019-09-16: qty 1

## 2019-09-16 NOTE — Plan of Care (Signed)
Pt uses 2L O2 most of the day.  O2 sats 93% on RA.  Dyspenic with exertion. Ambulated to bathroom independently.  Encouraged to use incentive spirometer throughout the shift.  Cough med given x2 with improvement.  Ibuprofen and tylenol given for fever/headache with improvement.   Pt reports sore throat. Meds given crushed with apple sauce or crushed with water.  Encouraged to drink H2O.

## 2019-09-16 NOTE — Plan of Care (Signed)
  Problem: Education: Goal: Knowledge of risk factors and measures for prevention of condition will improve Outcome: Progressing   Problem: Respiratory: Goal: Will maintain a patent airway Outcome: Progressing Goal: Complications related to the disease process, condition or treatment will be avoided or minimized Outcome: Progressing   Problem: Education: Goal: Knowledge of General Education information will improve Description: Including pain rating scale, medication(s)/side effects and non-pharmacologic comfort measures Outcome: Progressing   Problem: Clinical Measurements: Goal: Ability to maintain clinical measurements within normal limits will improve Outcome: Progressing Goal: Respiratory complications will improve Outcome: Progressing

## 2019-09-16 NOTE — Progress Notes (Signed)
PHARMACY - PHYSICIAN COMMUNICATION CRITICAL VALUE ALERT - BLOOD CULTURE IDENTIFICATION (BCID)  Ernest Schneider is an 39 y.o. male who presented to [facility] on 09/15/2019 with a chief complaint of Malaise, anorexia, shortness of breath and fevers  Assessment: 1 of 4 bottles, aerobic bottle with gram positive rods growing. No BCID available.   Name of physician (or Provider) Contacted: Dr Fran Lowes  Current antibiotics: None/ on remdesivir  Changes to prescribed antibiotics recommended:  Per MD probably contaminant. We can wait and see.    No results found for this or any previous visit.  [name] 09/16/2019  10:13 AM

## 2019-09-16 NOTE — Progress Notes (Signed)
PROGRESS NOTE    Ernest Schneider  RCV:[id number] DOB: June 15, 1980 DOA: 09/15/2019 PCP: Patient, No Pcp Per    Assessment & Plan:   Principal Problem:   Acute respiratory failure due to COVID-19 [facility]) Active Problems:   Pneumonia due to COVID-19 virus    Ernest Schneider is an 39 y.o. male on no chronic medications who was in his usual state of health until 5 days prior to admission when he developed onset of fevers, malaise and anorexia.  Patient notes he was lying in bed and did not have the energy to get out.  Notes that he had no hunger at all neither for liquids nor solids, has lost his sense of taste and smell.  4 days ago patient was seen in urgent care and tested positive for Covid.  Patient is not vaccinated against SARS-CoV-2.   Acute hypoxic respiratory failure 2/2 Pneumonia secondary to COVID-19 --Chest x-ray revealed bilateral pulmonary opacities concerning for multifocal pneumonia.  The patient was noted to be minimally hypoxic at rest with 89% O2 sats however when they tried to ambulate him his O2 sats dropped to 87% on room air with significantly increased respiratory rate and tachycardia.  Pt put on 2L [place]. --CRP 11 --started on Remdesivir and Decadron on presentation. PLAN: --continue Remdesivir and Decadron --suppl O2 to keep sat >=92%, wean as able --Zinc and Vit C --albuterol inhaler scheduled   DVT prophylaxis: Lovenox SQ Code Status: Full code  Family Communication:  Status is: inpatient Dispo:   The patient is from: home Anticipated d/c is to: home Anticipated d/c date is: 2-3 days Patient currently is not medically stable to d/c due to: new hypoxia needing O2, on COVID treatment   Subjective and Interval History:  Pt reported feeling much better.  Had some cough.  Had fever.  Appetite improved.     Objective: Vitals:   09/16/19 0808 09/16/19 1012 09/16/19 1146 09/16/19 1409  BP: (!) 125/86 (!) 130/94 (!) 132/87 112/72  Pulse: 82 92 77 83  Resp:  18 17 17 16   Temp: (!) 101.3 F (38.5 C) 98.4 F (36.9 C) 98.4 F (36.9 C) 98.2 F (36.8 C)  TempSrc: Oral Oral Oral Oral  SpO2: 95% 92% 93% 100%  Weight:      Height:        Intake/Output Summary (Last 24 hours) at 09/16/2019 1704 Last data filed at 09/16/2019 1532 Gross per 24 hour  Intake 113.99 ml  Output --  Net 113.99 ml   Filed Weights   09/14/19 2252  Weight: (!) 115.7 kg    Examination:   Constitutional: NAD, AAOx3 HEENT: conjunctivae and lids normal, EOMI CV: RRR no M,R,G. Distal pulses +2.  No cyanosis.   RESP: reduced effort, normal respiratory effort, on 2L  GI: +BS, NTND Extremities: No effusions, edema, or tenderness in BLE SKIN: warm, dry and intact Neuro: II - XII grossly intact.  Sensation intact Psych: Normal mood and affect.  Appropriate judgement and reason   Data Reviewed: I have personally reviewed following labs and imaging studies  CBC: Recent Labs  Lab 09/14/19 2259 09/15/19 1355 09/16/19 0602  WBC 8.8 5.1 10.1  NEUTROABS 5.8  --  7.0  HGB 14.9 14.4 13.8  HCT 42.7 42.0 40.1  MCV 84.4 85.4 85.7  PLT 124* 123* 143*   Basic Metabolic Panel: Recent Labs  Lab 09/14/19 2259 09/15/19 1355 09/16/19 0602  NA 136  --  137  K 3.5  --  3.7  CL 97*  --  100  CO2 24  --  27  GLUCOSE 117*  --  127*  BUN 11  --  15  CREATININE 1.21 1.02 1.19  CALCIUM 8.6*  --  8.6*  MG  --   --  1.9  PHOS  --   --  2.5   GFR: Estimated Creatinine Clearance: 113.2 mL/min (by C-G formula based on SCr of 1.19 mg/dL). Liver Function Tests: Recent Labs  Lab 09/14/19 2259 09/16/19 0602  AST 35 47*  ALT 19 21  ALKPHOS 87 68  BILITOT 0.9 0.8  PROT 7.9 7.1  ALBUMIN 4.0 3.5   No results for input(s): LIPASE, AMYLASE in the last 168 hours. No results for input(s): AMMONIA in the last 168 hours. Coagulation Profile: Recent Labs  Lab 09/14/19 2259  INR 0.9   Cardiac Enzymes: No results for input(s): CKTOTAL, CKMB, CKMBINDEX, TROPONINI in the last  168 hours. BNP (last 3 results) No results for input(s): PROBNP in the last 8760 hours. HbA1C: No results for input(s): HGBA1C in the last 72 hours. CBG: No results for input(s): GLUCAP in the last 168 hours. Lipid Profile: Recent Labs    09/15/19 0551  TRIG 118   Thyroid Function Tests: No results for input(s): TSH, T4TOTAL, FREET4, T3FREE, THYROIDAB in the last 72 hours. Anemia Panel: Recent Labs    09/15/19 0551 09/16/19 0602  FERRITIN 623* 751*   Sepsis Labs: Recent Labs  Lab 09/14/19 2259 09/15/19 0551  PROCALCITON  --  0.24  LATICACIDVEN 1.1  --     Recent Results (from the past 240 hour(s))  Culture, blood (Routine x 2)     Status: None (Preliminary result)   Collection Time: 09/14/19 10:59 PM   Specimen: BLOOD  Result Value Ref Range Status   Specimen Description BLOOD RIGHT ANTECUBITAL  Final   Special Requests   Final    BOTTLES DRAWN AEROBIC AND ANAEROBIC Blood Culture results may not be optimal due to an excessive volume of blood received in culture bottles   Culture   Final    NO GROWTH 2 DAYS Performed at [facility], [address] [place], [place] [address]    Report Status PENDING  Incomplete  Culture, blood (Routine x 2)     Status: None (Preliminary result)   Collection Time: 09/15/19  5:50 AM   Specimen: BLOOD  Result Value Ref Range Status   Specimen Description   Final    BLOOD BLOOD LEFT WRIST Performed at [facility], [address] [place], [place] [address]    Special Requests   Final    BOTTLES DRAWN AEROBIC AND ANAEROBIC Blood Culture adequate volume Performed at [facility], [address] [place], [place] [address]    Culture  Setup Time   Final    GRAM POSITIVE RODS AEROBIC BOTTLE ONLY CRITICAL RESULT CALLED TO, READ BACK BY AND VERIFIED WITH: Wilkie Aye 09/16/19 0848  Performed at [facility], [address] [place], [place] [address]    Culture   Final    CULTURE REINCUBATED  FOR BETTER GROWTH Performed at [facility], [address], [place], [place] [address]    Report Status PENDING  Incomplete      Radiology Studies: DG Chest 1 View  Result Date: 09/15/2019 CLINICAL DATA:  COVID pneumonia EXAM: CHEST  1 VIEW COMPARISON:  12/12/2018 FINDINGS: There is scattered bilateral pulmonary opacities, greatest in the right mid lung zone. There is  no pneumothorax. No large pleural effusion. The lung volumes are low. The cardiac silhouette is borderline enlarged. There is no acute osseous abnormality. IMPRESSION: Bilateral pulmonary opacities concerning for multifocal pneumonia (viral or bacterial). Electronically Signed   By: Katherine Mantle M.D.   On: 09/15/2019 03:44     Scheduled Meds: . albuterol  2 puff Inhalation Q6H  . vitamin C  500 mg Oral Daily  . dexamethasone (DECADRON) injection  6 mg Intravenous Q24H  . enoxaparin (LOVENOX) injection  40 mg Subcutaneous Q24H  . mouth rinse  15 mL Mouth Rinse BID  . multivitamin with minerals  1 tablet Oral Daily  . sodium chloride flush  3 mL Intravenous Q12H  . zinc sulfate  220 mg Oral Daily   Continuous Infusions: . sodium chloride Stopped (09/16/19 1111)  . remdesivir 100 mg in NS 100 mL Stopped (09/16/19 0946)     LOS: 1 day     Darlin Priestly, MD [facility] If 7PM-7AM, please contact night-coverage 09/16/2019, 5:04 PM

## 2019-09-17 LAB — BASIC METABOLIC PANEL
Anion gap: 12 (ref 5–15)
BUN: 17 mg/dL (ref 6–20)
CO2: 25 mmol/L (ref 22–32)
Calcium: 8.1 mg/dL — ABNORMAL LOW (ref 8.9–10.3)
Chloride: 102 mmol/L (ref 98–111)
Creatinine, Ser: 1.06 mg/dL (ref 0.61–1.24)
GFR calc Af Amer: >60 mL/min (ref >60)
GFR calc non Af Amer: >60 mL/min (ref >60)
Glucose, Bld: 118 mg/dL — ABNORMAL HIGH (ref 70–99)
Potassium: 3.5 mmol/L (ref 3.5–5.1)
Sodium: 139 mmol/L (ref 135–145)

## 2019-09-17 LAB — C-REACTIVE PROTEIN: CRP: 8.5 mg/dL — ABNORMAL HIGH (ref <1.0)

## 2019-09-17 LAB — CBC
HCT: 39.6 % (ref 39.0–52.0)
Hemoglobin: 13.9 g/dL (ref 13.0–17.0)
MCH: 29.4 pg (ref 26.0–34.0)
MCHC: 35.1 g/dL (ref 30.0–36.0)
MCV: 83.9 fL (ref 80.0–100.0)
Platelets: 176 10*3/uL (ref 150–400)
RBC: 4.72 MIL/uL (ref 4.22–5.81)
RDW: 12.4 % (ref 11.5–15.5)
WBC: 8.7 10*3/uL (ref 4.0–10.5)
nRBC: 0 % (ref 0.0–0.2)

## 2019-09-17 LAB — MAGNESIUM: Magnesium: 2 mg/dL (ref 1.7–2.4)

## 2019-09-17 MED ORDER — ACETAMINOPHEN 160 MG/5ML PO SOLN
650.0000 mg | Freq: Four times a day (QID) | ORAL | Status: DC | PRN
  Administered 2019-09-18: 20:00:00 650 mg via ORAL
  Filled 2019-09-17 (×2): qty 20.3

## 2019-09-17 MED ORDER — DEXAMETHASONE 4 MG PO TABS
6.0000 mg | ORAL_TABLET | Freq: Every day | ORAL | Status: DC
  Administered 2019-09-17 – 2019-09-18 (×2): 6 mg via ORAL
  Filled 2019-09-17 (×2): qty 2

## 2019-09-17 MED ORDER — IBUPROFEN 100 MG/5ML PO SUSP
400.0000 mg | Freq: Four times a day (QID) | ORAL | Status: DC | PRN
  Filled 2019-09-17: qty 20

## 2019-09-17 MED ORDER — MENTHOL 3 MG MT LOZG
1.0000 | LOZENGE | OROMUCOSAL | Status: DC | PRN
  Filled 2019-09-17: qty 9

## 2019-09-17 NOTE — Progress Notes (Signed)
PROGRESS NOTE    Ernest Schneider  GEZ:[id number] DOB: [birth year] DOA: 09/15/2019 PCP: Patient, No Pcp Per    Assessment & Plan:   Principal Problem:   Acute respiratory failure due to COVID-19 [facility]) Active Problems:   Pneumonia due to COVID-19 virus    Ernest Schneider is an 39 y.o. Caucasian male on no chronic medications who was in his usual state of health until 5 days prior to admission when he developed onset of fevers, malaise and anorexia.  Patient notes he was lying in bed and did not have the energy to get out.  Notes that he had no hunger at all neither for liquids nor solids, has lost his sense of taste and smell.  4 days ago patient was seen in urgent care and tested positive for Covid.  Patient is not vaccinated against SARS-CoV-2.   Acute hypoxic respiratory failure 2/2 Pneumonia secondary to COVID-19 --Chest x-ray revealed bilateral pulmonary opacities concerning for multifocal pneumonia.  The patient was noted to be minimally hypoxic at rest with 89% O2 sats however when they tried to ambulate him his O2 sats dropped to 87% on room air with significantly increased respiratory rate and tachycardia.  Pt put on 2L [place]. --CRP 11 --started on Remdesivir and Decadron on presentation. PLAN: --continue Remdesivir and Decadron --suppl O2 to keep sat >=92%, wean as able --Zinc and Vit C --albuterol inhaler scheduled   DVT prophylaxis: Lovenox SQ Code Status: Full code  Family Communication:  Status is: inpatient Dispo:   The patient is from: home Anticipated d/c is to: home Anticipated d/c date is: tomorrow Patient currently is not medically stable to d/c due to: new hypoxia needing O2, on COVID treatment, but pt wants to go home soon to take care of his family.  Will arrange for home O2 and outpatient infusion.   Subjective and Interval History:  Pt continued to have fever, however, reported feeling better.  Asked to go home soon because wife who is also sick with  COVID is taking care of 2 kids at home.   Objective: Vitals:   09/17/19 0452 09/17/19 0756 09/17/19 0826 09/17/19 1140  BP: (!) 140/82  (!) 136/81 121/85  Pulse: 80  81 86  Resp: 20 19 17 22   Temp: (!) 101.3 F (38.5 C)  99.1 F (37.3 C) 98.9 F (37.2 C)  TempSrc: Oral  Oral Oral  SpO2: 90% 92% 93% 90%  Weight:      Height:       No intake or output data in the 24 hours ending 09/17/19 1902 Filed Weights   09/14/19 2252  Weight: (!) 115.7 kg    Examination:   Constitutional: NAD, AAOx3 HEENT: conjunctivae and lids normal, EOMI CV: RRR no M,R,G. Distal pulses +2.  No cyanosis.   RESP: CTA B/L, reduced effort, on 2L GI: +BS, NTND Extremities: No effusions, edema, or tenderness in BLE SKIN: warm, dry and intact Neuro: II - XII grossly intact.  Sensation intact Psych: Normal mood and affect.  Appropriate judgement and reason     Data Reviewed: I have personally reviewed following labs and imaging studies  CBC: Recent Labs  Lab 09/14/19 2259 09/15/19 1355 09/16/19 0602 09/17/19 0705  WBC 8.8 5.1 10.1 8.7  NEUTROABS 5.8  --  7.0  --   HGB 14.9 14.4 13.8 13.9  HCT 42.7 42.0 40.1 39.6  MCV 84.4 85.4 85.7 83.9  PLT 124* 123* 143* 176   Basic Metabolic Panel: Recent Labs  Lab 09/14/19 2259 09/15/19 1355 09/16/19 0602 09/17/19 0705  NA 136  --  137 139  K 3.5  --  3.7 3.5  CL 97*  --  100 102  CO2 24  --  27 25  GLUCOSE 117*  --  127* 118*  BUN 11  --  15 17  CREATININE 1.21 1.02 1.19 1.06  CALCIUM 8.6*  --  8.6* 8.1*  MG  --   --  1.9 2.0  PHOS  --   --  2.5  --    GFR: Estimated Creatinine Clearance: 127.1 mL/min (by C-G formula based on SCr of 1.06 mg/dL). Liver Function Tests: Recent Labs  Lab 09/14/19 2259 09/16/19 0602  AST 35 47*  ALT 19 21  ALKPHOS 87 68  BILITOT 0.9 0.8  PROT 7.9 7.1  ALBUMIN 4.0 3.5   No results for input(s): LIPASE, AMYLASE in the last 168 hours. No results for input(s): AMMONIA in the last 168  hours. Coagulation Profile: Recent Labs  Lab 09/14/19 2259  INR 0.9   Cardiac Enzymes: No results for input(s): CKTOTAL, CKMB, CKMBINDEX, TROPONINI in the last 168 hours. BNP (last 3 results) No results for input(s): PROBNP in the last 8760 hours. HbA1C: No results for input(s): HGBA1C in the last 72 hours. CBG: No results for input(s): GLUCAP in the last 168 hours. Lipid Profile: Recent Labs    09/15/19 0551  TRIG 118   Thyroid Function Tests: No results for input(s): TSH, T4TOTAL, FREET4, T3FREE, THYROIDAB in the last 72 hours. Anemia Panel: Recent Labs    09/15/19 0551 09/16/19 0602  FERRITIN 623* 751*   Sepsis Labs: Recent Labs  Lab 09/14/19 2259 09/15/19 0551  PROCALCITON  --  0.24  LATICACIDVEN 1.1  --     Recent Results (from the past 240 hour(s))  Culture, blood (Routine x 2)     Status: None (Preliminary result)   Collection Time: 09/14/19 10:59 PM   Specimen: BLOOD  Result Value Ref Range Status   Specimen Description BLOOD RIGHT ANTECUBITAL  Final   Special Requests   Final    BOTTLES DRAWN AEROBIC AND ANAEROBIC Blood Culture results may not be optimal due to an excessive volume of blood received in culture bottles   Culture   Final    NO GROWTH 3 DAYS Performed at [facility], [address] [place], [place] [address]    Report Status PENDING  Incomplete  Culture, blood (Routine x 2)     Status: None (Preliminary result)   Collection Time: 09/15/19  5:50 AM   Specimen: BLOOD  Result Value Ref Range Status   Specimen Description   Final    BLOOD BLOOD LEFT WRIST Performed at [facility], [address], [place], [place] [address]    Special Requests   Final    BOTTLES DRAWN AEROBIC AND ANAEROBIC Blood Culture adequate volume Performed at [facility], [address] [place], [place] [address]    Culture  Setup Time   Final    GRAM POSITIVE RODS AEROBIC BOTTLE ONLY CRITICAL RESULT CALLED TO, READ BACK BY  AND VERIFIED WITH: Wilkie Aye 09/16/19 0848  Performed at [facility], [address] [place], [place] [address]    Culture   Final    CULTURE REINCUBATED FOR BETTER GROWTH Performed at [facility], [address], [place], [place] [address]    Report Status PENDING  Incomplete      Radiology Studies: No  results found.   Scheduled Meds: . albuterol  2 puff Inhalation Q6H  . vitamin C  500 mg Oral Daily  . dexamethasone  6 mg Oral Daily  . enoxaparin (LOVENOX) injection  40 mg Subcutaneous Q24H  . mouth rinse  15 mL Mouth Rinse BID  . multivitamin with minerals  1 tablet Oral Daily  . sodium chloride flush  3 mL Intravenous Q12H  . zinc sulfate  220 mg Oral Daily   Continuous Infusions: . sodium chloride 250 mL (09/17/19 1054)  . remdesivir 100 mg in NS 100 mL 100 mg (09/17/19 1055)     LOS: 2 days     Darlin Priestly, MD [facility] If 7PM-7AM, please contact night-coverage 09/17/2019, 7:02 PM

## 2019-09-18 LAB — BASIC METABOLIC PANEL
Anion gap: 10 (ref 5–15)
BUN: 15 mg/dL (ref 6–20)
CO2: 25 mmol/L (ref 22–32)
Calcium: 8.4 mg/dL — ABNORMAL LOW (ref 8.9–10.3)
Chloride: 102 mmol/L (ref 98–111)
Creatinine, Ser: 0.86 mg/dL (ref 0.61–1.24)
GFR calc Af Amer: >60 mL/min (ref >60)
GFR calc non Af Amer: >60 mL/min (ref >60)
Glucose, Bld: 117 mg/dL — ABNORMAL HIGH (ref 70–99)
Potassium: 3.7 mmol/L (ref 3.5–5.1)
Sodium: 137 mmol/L (ref 135–145)

## 2019-09-18 LAB — CULTURE, BLOOD (ROUTINE X 2): Special Requests: ADEQUATE

## 2019-09-18 LAB — CBC
HCT: 39 % (ref 39.0–52.0)
Hemoglobin: 13.1 g/dL (ref 13.0–17.0)
MCH: 29 pg (ref 26.0–34.0)
MCHC: 33.6 g/dL (ref 30.0–36.0)
MCV: 86.3 fL (ref 80.0–100.0)
Platelets: 209 10*3/uL (ref 150–400)
RBC: 4.52 MIL/uL (ref 4.22–5.81)
RDW: 12.3 % (ref 11.5–15.5)
WBC: 9.6 10*3/uL (ref 4.0–10.5)
nRBC: 0 % (ref 0.0–0.2)

## 2019-09-18 LAB — MAGNESIUM: Magnesium: 2 mg/dL (ref 1.7–2.4)

## 2019-09-18 LAB — C-REACTIVE PROTEIN: CRP: 14.9 mg/dL — ABNORMAL HIGH (ref <1.0)

## 2019-09-18 MED ORDER — METHYLPREDNISOLONE SODIUM SUCC 125 MG IJ SOLR: 125.0000 mg | Freq: Once | INTRAMUSCULAR | Status: DC | PRN

## 2019-09-18 MED ORDER — ALBUTEROL SULFATE HFA 108 (90 BASE) MCG/ACT IN AERS
2.0000 | INHALATION_SPRAY | Freq: Once | RESPIRATORY_TRACT | Status: DC | PRN
  Filled 2019-09-18: qty 6.7

## 2019-09-18 MED ORDER — SODIUM CHLORIDE 0.9 % IV SOLN: INTRAVENOUS | Status: DC | PRN

## 2019-09-18 MED ORDER — METHYLPREDNISOLONE SODIUM SUCC 40 MG IJ SOLR
40.0000 mg | Freq: Two times a day (BID) | INTRAMUSCULAR | Status: DC
  Administered 2019-09-18 – 2019-09-21 (×6): 40 mg via INTRAVENOUS
  Filled 2019-09-18 (×6): qty 1

## 2019-09-18 MED ORDER — FAMOTIDINE IN NACL 20-0.9 MG/50ML-% IV SOLN: 20.0000 mg | Freq: Once | INTRAVENOUS | Status: DC | PRN

## 2019-09-18 MED ORDER — DEXAMETHASONE 6 MG PO TABS: 6.0000 mg | ORAL_TABLET | Freq: Every day | ORAL | 0 refills | Status: DC

## 2019-09-18 MED ORDER — EPINEPHRINE 0.3 MG/0.3ML IJ SOAJ
0.3000 mg | Freq: Once | INTRAMUSCULAR | Status: DC | PRN
  Filled 2019-09-18: qty 0.6

## 2019-09-18 MED ORDER — GUAIFENESIN-DM 100-10 MG/5ML PO SYRP: 10.0000 mL | ORAL_SOLUTION | ORAL | 0 refills | Status: AC | PRN

## 2019-09-18 MED ORDER — SODIUM CHLORIDE 0.9 % IV SOLN: 100.0000 mg | Freq: Once | INTRAVENOUS | Status: DC

## 2019-09-18 MED ORDER — DIPHENHYDRAMINE HCL 50 MG/ML IJ SOLN: 50.0000 mg | Freq: Once | INTRAMUSCULAR | Status: DC | PRN

## 2019-09-18 NOTE — Progress Notes (Signed)
Patient scheduled for outpatient Remdesivir infusion at 10am on Thursday 7/29 at [facility]. Please inform the patient to park at [address] [place], [place], as staff will be escorting the patient through the east entrance of the hospital.    There is a wave flag banner located near the entrance on [address]. Turn into this entrance and immediately turn left and park in 1 of the 5 designated Covid Infusion Parking spots. There is a phone number on the sign, please call and let the staff know what spot you are in and we will come out and get you. For questions call [phone].  Thanks.

## 2019-09-18 NOTE — Progress Notes (Signed)
PROGRESS NOTE    KOKI BUXTON  BTD:[id number] DOB: March 08, 1980 DOA: 09/15/2019 PCP: Patient, No Pcp Per    Assessment & Plan:   Principal Problem:   Acute respiratory failure due to COVID-19 [facility]) Active Problems:   Pneumonia due to COVID-19 virus    PHU RECORD is an 39 y.o. Caucasian male on no chronic medications who was in his usual state of health until 5 days prior to admission when he developed onset of fevers, malaise and anorexia.  Patient notes he was lying in bed and did not have the energy to get out.  Notes that he had no hunger at all neither for liquids nor solids, has lost his sense of taste and smell.  4 days ago patient was seen in urgent care and tested positive for Covid.  Patient is not vaccinated against SARS-CoV-2.   Acute hypoxic respiratory failure 2/2 Pneumonia secondary to COVID-19 --Chest x-ray revealed bilateral pulmonary opacities concerning for multifocal pneumonia.  The patient was noted to be minimally hypoxic at rest with 89% O2 sats however when they tried to ambulate him his O2 sats dropped to 87% on room air with significantly increased respiratory rate and tachycardia.  Pt put on 2L [place], worsened to 4L today --CRP 11 --started on Remdesivir and Decadron on presentation. PLAN: --continue Remdesivir  --increase steroid to IV solumedrol 40 mg BID due to increasing CRP despite being on decadron --suppl O2 to keep sat >=92%, wean as able --Zinc and Vit C --albuterol inhaler scheduled   DVT prophylaxis: Lovenox SQ Code Status: Full code  Family Communication:  Status is: inpatient Dispo:   The patient is from: home Anticipated d/c is to: home Anticipated d/c date is: >3days Patient currently is not medically stable to d/c due to: new hypoxia needing O2, on COVID treatment, O2 requirement worsened today and CRP increasing despite steroid treatment.   Subjective and Interval History:  Had tried to arrange for pt to be discharged with  home O2 and outpatient infusion because pt asked to go home soon because wife who is also sick with COVID is taking care of 2 kids at home.  However, pt's dyspnea worsened this morning, so pt decided to stay inpatient.  Continues to have intermittent fevers.    Objective: Vitals:   09/17/19 2003 09/18/19 0002 09/18/19 0457 09/18/19 0831  BP: (!) 133/80 (!) 135/79 128/83 (!) 132/80  Pulse: 83 72 75 75  Resp:    19  Temp: 100.3 F (37.9 C) 100 F (37.8 C) 99 F (37.2 C) 98.8 F (37.1 C)  TempSrc: Oral Oral Oral Oral  SpO2: 92% 95% 95% 95%  Weight:      Height:       No intake or output data in the 24 hours ending 09/18/19 1731 Filed Weights   09/14/19 2252  Weight: (!) 115.7 kg    Examination:   Constitutional: NAD, AAOx3 HEENT: conjunctivae and lids normal, EOMI CV: RRR no M,R,G. Distal pulses +2.  No cyanosis.   RESP: CTA B/L, reduced effort, on 4L GI: +BS, NTND Extremities: No effusions, edema, or tenderness in BLE SKIN: warm, dry and intact Neuro: II - XII grossly intact.  Sensation intact Psych: Normal mood and affect.     Data Reviewed: I have personally reviewed following labs and imaging studies  CBC: Recent Labs  Lab 09/14/19 2259 09/15/19 1355 09/16/19 0602 09/17/19 0705 09/18/19 0540  WBC 8.8 5.1 10.1 8.7 9.6  NEUTROABS 5.8  --  7.0  --   --  HGB 14.9 14.4 13.8 13.9 13.1  HCT 42.7 42.0 40.1 39.6 39.0  MCV 84.4 85.4 85.7 83.9 86.3  PLT 124* 123* 143* 176 209   Basic Metabolic Panel: Recent Labs  Lab 09/14/19 2259 09/15/19 1355 09/16/19 0602 09/17/19 0705 09/18/19 0540  NA 136  --  137 139 137  K 3.5  --  3.7 3.5 3.7  CL 97*  --  100 102 102  CO2 24  --  27 25 25   GLUCOSE 117*  --  127* 118* 117*  BUN 11  --  15 17 15   CREATININE 1.21 1.02 1.19 1.06 0.86  CALCIUM 8.6*  --  8.6* 8.1* 8.4*  MG  --   --  1.9 2.0 2.0  PHOS  --   --  2.5  --   --    GFR: Estimated Creatinine Clearance: 156.7 mL/min (by C-G formula based on SCr of 0.86  mg/dL). Liver Function Tests: Recent Labs  Lab 09/14/19 2259 09/16/19 0602  AST 35 47*  ALT 19 21  ALKPHOS 87 68  BILITOT 0.9 0.8  PROT 7.9 7.1  ALBUMIN 4.0 3.5   No results for input(s): LIPASE, AMYLASE in the last 168 hours. No results for input(s): AMMONIA in the last 168 hours. Coagulation Profile: Recent Labs  Lab 09/14/19 2259  INR 0.9   Cardiac Enzymes: No results for input(s): CKTOTAL, CKMB, CKMBINDEX, TROPONINI in the last 168 hours. BNP (last 3 results) No results for input(s): PROBNP in the last 8760 hours. HbA1C: No results for input(s): HGBA1C in the last 72 hours. CBG: No results for input(s): GLUCAP in the last 168 hours. Lipid Profile: No results for input(s): CHOL, HDL, LDLCALC, TRIG, CHOLHDL, LDLDIRECT in the last 72 hours. Thyroid Function Tests: No results for input(s): TSH, T4TOTAL, FREET4, T3FREE, THYROIDAB in the last 72 hours. Anemia Panel: Recent Labs    09/16/19 0602  FERRITIN 751*   Sepsis Labs: Recent Labs  Lab 09/14/19 2259 09/15/19 0551  PROCALCITON  --  0.24  LATICACIDVEN 1.1  --     Recent Results (from the past 240 hour(s))  Culture, blood (Routine x 2)     Status: None (Preliminary result)   Collection Time: 09/14/19 10:59 PM   Specimen: BLOOD  Result Value Ref Range Status   Specimen Description BLOOD RIGHT ANTECUBITAL  Final   Special Requests   Final    BOTTLES DRAWN AEROBIC AND ANAEROBIC Blood Culture results may not be optimal due to an excessive volume of blood received in culture bottles   Culture   Final    NO GROWTH 4 DAYS Performed at [facility], [address] [place], [address] [place]    Report Status PENDING  Incomplete  Culture, blood (Routine x 2)     Status: Abnormal   Collection Time: 09/15/19  5:50 AM   Specimen: BLOOD  Result Value Ref Range Status   Specimen Description   Final    BLOOD BLOOD LEFT WRIST Performed at [facility], [address] [place], [address] [place]     Special Requests   Final    BOTTLES DRAWN AEROBIC AND ANAEROBIC Blood Culture adequate volume Performed at [facility], [address] [place], [address] [place]    Culture  Setup Time   Final    GRAM POSITIVE RODS AEROBIC BOTTLE ONLY CRITICAL RESULT CALLED TO, READ BACK BY AND VERIFIED WITH: [place] 09/16/19 0848  Performed at [facility], [address]  [place], [place] [address]    Culture (A)  Final    CORYNEBACTERIUM AMYCOLATUM Standardized susceptibility testing for this organism is not available. Performed at [facility], [address], [place], [place] [address]    Report Status 09/18/2019 FINAL  Final      Radiology Studies: No results found.   Scheduled Meds: . albuterol  2 puff Inhalation Q6H  . vitamin C  500 mg Oral Daily  . dexamethasone  6 mg Oral Daily  . enoxaparin (LOVENOX) injection  40 mg Subcutaneous Q24H  . mouth rinse  15 mL Mouth Rinse BID  . multivitamin with minerals  1 tablet Oral Daily  . sodium chloride flush  3 mL Intravenous Q12H  . zinc sulfate  220 mg Oral Daily   Continuous Infusions: . sodium chloride 250 mL (09/17/19 1054)  . sodium chloride    . famotidine (PEPCID) IV    . remdesivir 100 mg in NS 100 mL 100 mg (09/18/19 0945)     LOS: 3 days     Darlin Priestly, MD [facility] If 7PM-7AM, please contact night-coverage 09/18/2019, 5:31 PM

## 2019-09-18 NOTE — Discharge Instructions (Signed)
You are scheduled for outpatient Remdesivir infusion at 10am on Thursday 7/29 at [facility]. Please park at [address] [place], [place], as staff will be escorting you through the east entrance of the hospital.    There is a wave flag banner located near the entrance on [address]. Turn into this entrance and immediately turn left and park in 1 of the 5 designated Covid Infusion Parking spots. There is a phone number on the sign, please call and let the staff know what spot you are in and we will come out and get you. For questions call [phone].  Thanks.

## 2019-09-19 LAB — CBC
HCT: 39.6 % (ref 39.0–52.0)
Hemoglobin: 14.1 g/dL (ref 13.0–17.0)
MCH: 29.7 pg (ref 26.0–34.0)
MCHC: 35.6 g/dL (ref 30.0–36.0)
MCV: 83.4 fL (ref 80.0–100.0)
Platelets: 290 10*3/uL (ref 150–400)
RBC: 4.75 MIL/uL (ref 4.22–5.81)
RDW: 12.2 % (ref 11.5–15.5)
WBC: 7 10*3/uL (ref 4.0–10.5)
nRBC: 0 % (ref 0.0–0.2)

## 2019-09-19 LAB — MAGNESIUM: Magnesium: 2.5 mg/dL — ABNORMAL HIGH (ref 1.7–2.4)

## 2019-09-19 LAB — BASIC METABOLIC PANEL
Anion gap: 11 (ref 5–15)
BUN: 17 mg/dL (ref 6–20)
CO2: 24 mmol/L (ref 22–32)
Calcium: 8.7 mg/dL — ABNORMAL LOW (ref 8.9–10.3)
Chloride: 101 mmol/L (ref 98–111)
Creatinine, Ser: 0.82 mg/dL (ref 0.61–1.24)
GFR calc Af Amer: >60 mL/min (ref >60)
GFR calc non Af Amer: >60 mL/min (ref >60)
Glucose, Bld: 177 mg/dL — ABNORMAL HIGH (ref 70–99)
Potassium: 4.3 mmol/L (ref 3.5–5.1)
Sodium: 136 mmol/L (ref 135–145)

## 2019-09-19 LAB — CULTURE, BLOOD (ROUTINE X 2): Culture: NO GROWTH

## 2019-09-19 LAB — C-REACTIVE PROTEIN: CRP: 16.7 mg/dL — ABNORMAL HIGH (ref <1.0)

## 2019-09-19 LAB — PROCALCITONIN: Procalcitonin: <0.1 ng/mL

## 2019-09-19 NOTE — Progress Notes (Addendum)
PROGRESS NOTE    Ernest Schneider  JHE:[id number] DOB: [birth year] DOA: 09/15/2019 PCP: Patient, No Pcp Per    Assessment & Plan:   Principal Problem:   Acute respiratory failure due to COVID-19 [facility]) Active Problems:   Pneumonia due to COVID-19 virus    Ernest Schneider is an 39 y.o. Caucasian male on no chronic medications who was in his usual state of health until 5 days prior to admission when he developed onset of fevers, malaise and anorexia.  Patient notes he was lying in bed and did not have the energy to get out.  Notes that he had no hunger at all neither for liquids nor solids, has lost his sense of taste and smell.  4 days ago patient was seen in urgent care and tested positive for Covid.  Patient is not vaccinated against SARS-CoV-2.   Acute hypoxic respiratory failure 2/2 Pneumonia secondary to COVID-19 --Chest x-ray revealed bilateral pulmonary opacities concerning for multifocal pneumonia.  The patient was noted to be minimally hypoxic at rest with 89% O2 sats however when they tried to ambulate him his O2 sats dropped to 87% on room air with significantly increased respiratory rate and tachycardia.  Pt put on 2L [place], worsened to 4L, then 10L today --CRP trending up despite being started on decadron --started on Remdesivir and Decadron on presentation. PLAN: --continue Remdesivir  --continue IV solumedrol 40 mg BID due to increasing CRP despite being on decadron --suppl O2 to keep sat >=92%, wean as able --Zinc and Vit C --albuterol inhaler scheduled   DVT prophylaxis: Lovenox SQ Code Status: Full code  Family Communication:  Status is: inpatient Dispo:   The patient is from: home Anticipated d/c is to: home Anticipated d/c date is: >3days Patient currently is not medically stable to d/c due to: new hypoxia needing O2, on COVID treatment, O2 requirement worsened today and CRP increasing despite steroid treatment.   Subjective and Interval History:  Increased  O2 requirement this morning, up to 10L.  Pt didn't feel worsening dyspnea.  Minimum clear sputum production.  Continued to have intermittent fevers.   Objective: Vitals:   09/19/19 0857 09/19/19 0917 09/19/19 1300 09/19/19 1734  BP: (!) 128/87  110/74 (!) 136/88  Pulse: 75  67 71  Resp:   23 18  Temp: 98.4 F (36.9 C)  98.4 F (36.9 C) 98.2 F (36.8 C)  TempSrc: Oral   Oral  SpO2: 90% 96%  95%  Weight:      Height:       No intake or output data in the 24 hours ending 09/19/19 1940 Filed Weights   09/14/19 2252  Weight: (!) 115.7 kg    Examination:   Constitutional: NAD, AAOx3 HEENT: conjunctivae and lids normal, EOMI CV: RRR no M,R,G. Distal pulses +2.  No cyanosis.   RESP: CTA B/L, reduced effort, on 10L GI: +BS, NTND Extremities: No effusions, edema, or tenderness in BLE SKIN: warm, dry and intact Neuro: II - XII grossly intact.  Sensation intact Psych: Normal mood and affect.     Data Reviewed: I have personally reviewed following labs and imaging studies  CBC: Recent Labs  Lab 09/14/19 2259 09/14/19 2259 09/15/19 1355 09/16/19 0602 09/17/19 0705 09/18/19 0540 09/19/19 0622  WBC 8.8   < > 5.1 10.1 8.7 9.6 7.0  NEUTROABS 5.8  --   --  7.0  --   --   --   HGB 14.9   < > 14.4 13.8 13.9  13.1 14.1  HCT 42.7   < > 42.0 40.1 39.6 39.0 39.6  MCV 84.4   < > 85.4 85.7 83.9 86.3 83.4  PLT 124*   < > 123* 143* 176 209 290   < > = values in this interval not displayed.   Basic Metabolic Panel: Recent Labs  Lab 09/14/19 2259 09/14/19 2259 09/15/19 1355 09/16/19 0602 09/17/19 0705 09/18/19 0540 09/19/19 0622  NA 136  --   --  137 139 137 136  K 3.5  --   --  3.7 3.5 3.7 4.3  CL 97*  --   --  100 102 102 101  CO2 24  --   --  27 25 25 24   GLUCOSE 117*  --   --  127* 118* 117* 177*  BUN 11  --   --  15 17 15 17   CREATININE 1.21   < > 1.02 1.19 1.06 0.86 0.82  CALCIUM 8.6*  --   --  8.6* 8.1* 8.4* 8.7*  MG  --   --   --  1.9 2.0 2.0 2.5*  PHOS  --   --    --  2.5  --   --   --    < > = values in this interval not displayed.   GFR: Estimated Creatinine Clearance: 164.3 mL/min (by C-G formula based on SCr of 0.82 mg/dL). Liver Function Tests: Recent Labs  Lab 09/14/19 2259 09/16/19 0602  AST 35 47*  ALT 19 21  ALKPHOS 87 68  BILITOT 0.9 0.8  PROT 7.9 7.1  ALBUMIN 4.0 3.5   No results for input(s): LIPASE, AMYLASE in the last 168 hours. No results for input(s): AMMONIA in the last 168 hours. Coagulation Profile: Recent Labs  Lab 09/14/19 2259  INR 0.9   Cardiac Enzymes: No results for input(s): CKTOTAL, CKMB, CKMBINDEX, TROPONINI in the last 168 hours. BNP (last 3 results) No results for input(s): PROBNP in the last 8760 hours. HbA1C: No results for input(s): HGBA1C in the last 72 hours. CBG: No results for input(s): GLUCAP in the last 168 hours. Lipid Profile: No results for input(s): CHOL, HDL, LDLCALC, TRIG, CHOLHDL, LDLDIRECT in the last 72 hours. Thyroid Function Tests: No results for input(s): TSH, T4TOTAL, FREET4, T3FREE, THYROIDAB in the last 72 hours. Anemia Panel: No results for input(s): VITAMINB12, FOLATE, FERRITIN, TIBC, IRON, RETICCTPCT in the last 72 hours. Sepsis Labs: Recent Labs  Lab 09/14/19 2259 09/15/19 0551 09/19/19 0622  PROCALCITON  --  0.24 <0.10  LATICACIDVEN 1.1  --   --     Recent Results (from the past 240 hour(s))  Culture, blood (Routine x 2)     Status: None   Collection Time: 09/14/19 10:59 PM   Specimen: BLOOD  Result Value Ref Range Status   Specimen Description BLOOD RIGHT ANTECUBITAL  Final   Special Requests   Final    BOTTLES DRAWN AEROBIC AND ANAEROBIC Blood Culture results may not be optimal due to an excessive volume of blood received in culture bottles   Culture   Final    NO GROWTH 5 DAYS Performed at [facility], [address] [place], [address] [place]    Report Status 09/19/2019 FINAL  Final  Culture, blood (Routine x 2)     Status: Abnormal    Collection Time: 09/15/19  5:50 AM   Specimen: BLOOD  Result Value Ref Range Status   Specimen Description   Final    BLOOD BLOOD LEFT  WRIST Performed at [facility], [address], [place], [place] [address]    Special Requests   Final    BOTTLES DRAWN AEROBIC AND ANAEROBIC Blood Culture adequate volume Performed at [facility], [address] [place], [place] [address]    Culture  Setup Time   Final    GRAM POSITIVE RODS AEROBIC BOTTLE ONLY CRITICAL RESULT CALLED TO, READ BACK BY AND VERIFIED WITH: Wilkie Aye 09/16/19 0848  Performed at [facility], [address] [place], [place] [address]    Culture (A)  Final    CORYNEBACTERIUM AMYCOLATUM Standardized susceptibility testing for this organism is not available. Performed at [facility], [address] [place], [place] [address]    Report Status 09/18/2019 FINAL  Final      Radiology Studies: No results found.   Scheduled Meds: . albuterol  2 puff Inhalation Q6H  . vitamin C  500 mg Oral Daily  . enoxaparin (LOVENOX) injection  40 mg Subcutaneous Q24H  . mouth rinse  15 mL Mouth Rinse BID  . methylPREDNISolone (SOLU-MEDROL) injection  40 mg Intravenous Q12H  . multivitamin with minerals  1 tablet Oral Daily  . sodium chloride flush  3 mL Intravenous Q12H  . zinc sulfate  220 mg Oral Daily   Continuous Infusions: . sodium chloride 250 mL (09/17/19 1054)  . sodium chloride    . famotidine (PEPCID) IV       LOS: 4 days     Darlin Priestly, MD [facility] If 7PM-7AM, please contact night-coverage 09/19/2019, 7:40 PM

## 2019-09-20 ENCOUNTER — Ambulatory Visit (HOSPITAL_COMMUNITY): Payer: Managed Care, Other (non HMO)

## 2019-09-20 LAB — C-REACTIVE PROTEIN: CRP: 7.5 mg/dL — ABNORMAL HIGH (ref <1.0)

## 2019-09-20 LAB — BASIC METABOLIC PANEL
Anion gap: 10 (ref 5–15)
BUN: 21 mg/dL — ABNORMAL HIGH (ref 6–20)
CO2: 26 mmol/L (ref 22–32)
Calcium: 8.9 mg/dL (ref 8.9–10.3)
Chloride: 102 mmol/L (ref 98–111)
Creatinine, Ser: 0.87 mg/dL (ref 0.61–1.24)
GFR calc Af Amer: >60 mL/min (ref >60)
GFR calc non Af Amer: >60 mL/min (ref >60)
Glucose, Bld: 170 mg/dL — ABNORMAL HIGH (ref 70–99)
Potassium: 4.2 mmol/L (ref 3.5–5.1)
Sodium: 138 mmol/L (ref 135–145)

## 2019-09-20 LAB — CBC
HCT: 42.9 % (ref 39.0–52.0)
Hemoglobin: 14.3 g/dL (ref 13.0–17.0)
MCH: 29.1 pg (ref 26.0–34.0)
MCHC: 33.3 g/dL (ref 30.0–36.0)
MCV: 87.4 fL (ref 80.0–100.0)
Platelets: 414 10*3/uL — ABNORMAL HIGH (ref 150–400)
RBC: 4.91 MIL/uL (ref 4.22–5.81)
RDW: 12.2 % (ref 11.5–15.5)
WBC: 11.8 10*3/uL — ABNORMAL HIGH (ref 4.0–10.5)
nRBC: 0 % (ref 0.0–0.2)

## 2019-09-20 LAB — MAGNESIUM: Magnesium: 2.3 mg/dL (ref 1.7–2.4)

## 2019-09-20 NOTE — Progress Notes (Signed)
PROGRESS NOTE    Ernest Schneider  AJG:[id number] DOB: Oct 22, 1980 DOA: 09/15/2019 PCP: Patient, No Pcp Per    Assessment & Plan:   Principal Problem:   Acute respiratory failure due to COVID-19 [facility]) Active Problems:   Pneumonia due to COVID-19 virus    Ernest Schneider is an 39 y.o. Caucasian male on no chronic medications who was in his usual state of health until 5 days prior to admission when he developed onset of fevers, malaise and anorexia.  Patient notes he was lying in bed and did not have the energy to get out.  Notes that he had no hunger at all neither for liquids nor solids, has lost his sense of taste and smell.  4 days ago patient was seen in urgent care and tested positive for Covid.  Patient is not vaccinated against SARS-CoV-2.   Acute hypoxic respiratory failure 2/2 Pneumonia secondary to COVID-19 --Chest x-ray revealed bilateral pulmonary opacities concerning for multifocal pneumonia.  The patient was noted to be minimally hypoxic at rest with 89% O2 sats however when they tried to ambulate him his O2 sats dropped to 87% on room air with significantly increased respiratory rate and tachycardia.  Pt put on 2L [place], worsened to 4L, then 10L on 7/28, down to 6L today --started on Remdesivir and Decadron on presentation. --completed 5 days of Remdesivir. PLAN: --continue IV solumedrol 40 mg BID due to increasing CRP despite being on decadron --suppl O2 to keep sat >=92%, wean as able --Zinc and Vit C --albuterol inhaler scheduled   DVT prophylaxis: Lovenox SQ Code Status: Full code  Family Communication:  Status is: inpatient Dispo:   The patient is from: home Anticipated d/c is to: home Anticipated d/c date is: 2-3 days Patient currently is not medically stable to d/c due to: new hypoxia needing O2, on COVID treatment, O2 requirement still high at 6L.   Subjective and Interval History:  Pt reported feeling much better.  No more fever.  O2 requirement down to  6L.   Objective: Vitals:   09/20/19 0508 09/20/19 0858 09/20/19 1331 09/20/19 1511  BP:  (!) 124/91 (!) 118/90 118/85  Pulse:  66 70 72  Resp:  18 20 18   Temp:  98.2 F (36.8 C) 98.4 F (36.9 C) 98.9 F (37.2 C)  TempSrc:  Oral Oral Oral  SpO2: 93% 91% 92% 93%  Weight:      Height:        Intake/Output Summary (Last 24 hours) at 09/20/2019 1905 Last data filed at 09/19/2019 2312 Gross per 24 hour  Intake --  Output 300 ml  Net -300 ml   Filed Weights   09/14/19 2252  Weight: (!) 115.7 kg    Examination:   Constitutional: NAD, AAOx3 HEENT: conjunctivae and lids normal, EOMI CV: RRR no M,R,G. Distal pulses +2.  No cyanosis.   RESP: CTA B/L, reduced effort, on 6L GI: +BS, NTND Extremities: No effusions, edema, or tenderness in BLE SKIN: warm, dry and intact Neuro: II - XII grossly intact.  Sensation intact Psych: Normal mood and affect.     Data Reviewed: I have personally reviewed following labs and imaging studies  CBC: Recent Labs  Lab 09/14/19 2259 09/15/19 1355 09/16/19 0602 09/17/19 0705 09/18/19 0540 09/19/19 0622 09/20/19 0533  WBC 8.8   < > 10.1 8.7 9.6 7.0 11.8*  NEUTROABS 5.8  --  7.0  --   --   --   --   HGB 14.9   < >  13.8 13.9 13.1 14.1 14.3  HCT 42.7   < > 40.1 39.6 39.0 39.6 42.9  MCV 84.4   < > 85.7 83.9 86.3 83.4 87.4  PLT 124*   < > 143* 176 209 290 414*   < > = values in this interval not displayed.   Basic Metabolic Panel: Recent Labs  Lab 09/16/19 0602 09/17/19 0705 09/18/19 0540 09/19/19 0622 09/20/19 0533  NA 137 139 137 136 138  K 3.7 3.5 3.7 4.3 4.2  CL 100 102 102 101 102  CO2 27 25 25 24 26   GLUCOSE 127* 118* 117* 177* 170*  BUN 15 17 15 17  21*  CREATININE 1.19 1.06 0.86 0.82 0.87  CALCIUM 8.6* 8.1* 8.4* 8.7* 8.9  MG 1.9 2.0 2.0 2.5* 2.3  PHOS 2.5  --   --   --   --    GFR: Estimated Creatinine Clearance: 154.9 mL/min (by C-G formula based on SCr of 0.87 mg/dL). Liver Function Tests: Recent Labs  Lab  09/14/19 2259 09/16/19 0602  AST 35 47*  ALT 19 21  ALKPHOS 87 68  BILITOT 0.9 0.8  PROT 7.9 7.1  ALBUMIN 4.0 3.5   No results for input(s): LIPASE, AMYLASE in the last 168 hours. No results for input(s): AMMONIA in the last 168 hours. Coagulation Profile: Recent Labs  Lab 09/14/19 2259  INR 0.9   Cardiac Enzymes: No results for input(s): CKTOTAL, CKMB, CKMBINDEX, TROPONINI in the last 168 hours. BNP (last 3 results) No results for input(s): PROBNP in the last 8760 hours. HbA1C: No results for input(s): HGBA1C in the last 72 hours. CBG: No results for input(s): GLUCAP in the last 168 hours. Lipid Profile: No results for input(s): CHOL, HDL, LDLCALC, TRIG, CHOLHDL, LDLDIRECT in the last 72 hours. Thyroid Function Tests: No results for input(s): TSH, T4TOTAL, FREET4, T3FREE, THYROIDAB in the last 72 hours. Anemia Panel: No results for input(s): VITAMINB12, FOLATE, FERRITIN, TIBC, IRON, RETICCTPCT in the last 72 hours. Sepsis Labs: Recent Labs  Lab 09/14/19 2259 09/15/19 0551 09/19/19 0622  PROCALCITON  --  0.24 <0.10  LATICACIDVEN 1.1  --   --     Recent Results (from the past 240 hour(s))  Culture, blood (Routine x 2)     Status: None   Collection Time: 09/14/19 10:59 PM   Specimen: BLOOD  Result Value Ref Range Status   Specimen Description BLOOD RIGHT ANTECUBITAL  Final   Special Requests   Final    BOTTLES DRAWN AEROBIC AND ANAEROBIC Blood Culture results may not be optimal due to an excessive volume of blood received in culture bottles   Culture   Final    NO GROWTH 5 DAYS Performed at [facility], [address], [place], [address] [place]    Report Status 09/19/2019 FINAL  Final  Culture, blood (Routine x 2)     Status: Abnormal   Collection Time: 09/15/19  5:50 AM   Specimen: BLOOD  Result Value Ref Range Status   Specimen Description   Final    BLOOD BLOOD LEFT WRIST Performed at [facility], [address] [place],  [address] [place]    Special Requests   Final    BOTTLES DRAWN AEROBIC AND ANAEROBIC Blood Culture adequate volume Performed at [facility], [address], [place], [address] [place]    Culture  Setup Time   Final    GRAM POSITIVE RODS AEROBIC BOTTLE ONLY CRITICAL RESULT CALLED TO, READ BACK BY AND VERIFIED  WITH: Wilkie Aye 09/16/19 0848  Performed at [facility], [address] [place], [place] [address]    Culture (A)  Final    CORYNEBACTERIUM AMYCOLATUM Standardized susceptibility testing for this organism is not available. Performed at [facility], [address], [place], [place] [address]    Report Status 09/18/2019 FINAL  Final      Radiology Studies: No results found.   Scheduled Meds: . albuterol  2 puff Inhalation Q6H  . vitamin C  500 mg Oral Daily  . enoxaparin (LOVENOX) injection  40 mg Subcutaneous Q24H  . mouth rinse  15 mL Mouth Rinse BID  . methylPREDNISolone (SOLU-MEDROL) injection  40 mg Intravenous Q12H  . multivitamin with minerals  1 tablet Oral Daily  . sodium chloride flush  3 mL Intravenous Q12H  . zinc sulfate  220 mg Oral Daily   Continuous Infusions: . sodium chloride 250 mL (09/17/19 1054)  . sodium chloride    . famotidine (PEPCID) IV       LOS: 5 days     Darlin Priestly, MD [facility] If 7PM-7AM, please contact night-coverage 09/20/2019, 7:05 PM

## 2019-09-21 LAB — BASIC METABOLIC PANEL
Anion gap: 8 (ref 5–15)
BUN: 20 mg/dL (ref 6–20)
CO2: 27 mmol/L (ref 22–32)
Calcium: 8.6 mg/dL — ABNORMAL LOW (ref 8.9–10.3)
Chloride: 103 mmol/L (ref 98–111)
Creatinine, Ser: 0.84 mg/dL (ref 0.61–1.24)
GFR calc Af Amer: >60 mL/min (ref >60)
GFR calc non Af Amer: >60 mL/min (ref >60)
Glucose, Bld: 165 mg/dL — ABNORMAL HIGH (ref 70–99)
Potassium: 4.3 mmol/L (ref 3.5–5.1)
Sodium: 138 mmol/L (ref 135–145)

## 2019-09-21 LAB — CBC
HCT: 42 % (ref 39.0–52.0)
Hemoglobin: 14.4 g/dL (ref 13.0–17.0)
MCH: 29 pg (ref 26.0–34.0)
MCHC: 34.3 g/dL (ref 30.0–36.0)
MCV: 84.5 fL (ref 80.0–100.0)
Platelets: 442 10*3/uL — ABNORMAL HIGH (ref 150–400)
RBC: 4.97 MIL/uL (ref 4.22–5.81)
RDW: 12.1 % (ref 11.5–15.5)
WBC: 12 10*3/uL — ABNORMAL HIGH (ref 4.0–10.5)
nRBC: 0 % (ref 0.0–0.2)

## 2019-09-21 LAB — MAGNESIUM: Magnesium: 2.4 mg/dL (ref 1.7–2.4)

## 2019-09-21 LAB — C-REACTIVE PROTEIN: CRP: 2.4 mg/dL — ABNORMAL HIGH (ref <1.0)

## 2019-09-21 MED ORDER — DEXAMETHASONE 6 MG PO TABS: 6.0000 mg | ORAL_TABLET | Freq: Every day | ORAL | 0 refills | Status: AC

## 2019-09-21 MED ORDER — ALBUTEROL SULFATE HFA 108 (90 BASE) MCG/ACT IN AERS: 2.0000 | INHALATION_SPRAY | Freq: Four times a day (QID) | RESPIRATORY_TRACT | Status: AC

## 2019-09-21 NOTE — Progress Notes (Signed)
Received Md order to discharge patient to home, reviewed home meds, discharge instructions, follow up appointments  and prescriptions with patient and patient verbalized understanding.

## 2019-09-21 NOTE — Discharge Summary (Signed)
Physician Discharge Summary   Ernest Schneider  male DOB: Jul 06, 1980  WUJ:[id number]  PCP: Patient, No Pcp Per  Admit date: 09/15/2019 Discharge date:   Admitted From: home Disposition:  home CODE STATUS: Full code  Discharge Instructions    MyChart COVID-19 home monitoring program   Complete by: Sep 18, 2019    Is the patient willing to use the MyChart Mobile App for home monitoring?: Yes   Temperature monitoring   Complete by: Sep 18, 2019    After how many days would you like to receive a notification of this patient's flowsheet entries?: 1   Discharge instructions   Complete by: As directed    You have finished your 5 days of IV Remdesivir for COVID treatment, and received strong steroid to help calm down your body's inflammation.  Please finish taking 2 more days of steroids with Decadron as directed.  Your oxygen level still drops when you exert yourself, so be sure to take it easy and take more big breaths when you are up and walking around, and take frequent rests.   Dr. Darlin Priestly - -   Increase activity slowly   Complete by: As directed        Hospital Course:  For full details, please see H&P, progress notes, consult notes and ancillary notes.  Briefly,  [name] an 39 y.o.Caucasian maleon no chronic medications who was in his usual state of health until 5 days prior to admission when he developed onset of fevers, malaise and anorexia. Patient noted he was lying in bed and did not have the energy to get out. Noted that he had no hunger at all neither for liquids nor solids,has lost his sense of taste and smell. 4 days ago patient was seen in urgent care and tested positive for Covid. Patient is not vaccinated against SARS-CoV-2.   Acute hypoxic respiratory failure 2/2 Pneumonia secondary to COVID-19 Chest x-ray revealed bilateral pulmonary opacities concerning for multifocal pneumonia.  On presentation, patientwas noted to be minimally hypoxic  at rest with 89% O2 sats however when they tried to ambulate him his O2 sats dropped to 87% on room air with significantly increased respiratory rate and tachycardia.  Pt put on 2L [place], worsened to 4L, peaked at 10L on 7/28, down to 6L on 7/29.  Pt was put on IV solumedrol 40 mg BID due to his CRP increasing despite being started on Decadron 6 mg daily on presentation.  Pt finished 5 days of Remdesivir and received scheduled albuterol inhaler.  Pt was eager to go home because his wife was also sick with COVID and taking care of their 2 kids at home.  On the day of discharge, pt was sating well on room air at rest.  O2 sat did drop while ambulating, however, with deep more frequent breaths, pt was able to bring up his O2 sat to 90's.  Pt was therefore discharged with the caution to take it easy and take more big breaths when up and walking around.  Since pt doesn't have a PCP, he was referred to pulm Dr. Karna Christmas for post-COVID followup.   Discharge Diagnoses:  Principal Problem:   Acute respiratory failure due to COVID-19 [facility]) Active Problems:   Pneumonia due to COVID-19 virus    Discharge Instructions:  Allergies as of 09/21/2019   No Known Allergies     Medication List    TAKE these medications   albuterol 108 (90 Base) MCG/ACT inhaler Commonly known  as: VENTOLIN HFA Inhale 2 puffs into the lungs every 6 (six) hours for 10 days.   Alka-Seltzer Plus Cold & Flu 10-12.5-20-650 MG Pack Generic drug: Phenyleph-Doxylamine-DM-APAP Take by mouth.   dexamethasone 6 MG tablet Commonly known as: DECADRON Take 1 tablet (6 mg total) by mouth daily for 2 days. Steroid. Start taking on: September 22, 2019   [facility] Extra Strength [phone] MG Pack Generic drug: Aspirin-Acetaminophen-Caffeine Take 1 packet by mouth every 6 (six) hours as needed.   guaiFENesin-dextromethorphan 100-10 MG/5ML syrup Commonly known as: ROBITUSSIN DM Take 10 mLs by mouth every 4 (four) hours as needed for up to 10 days  for cough.   ondansetron 4 MG tablet Commonly known as: ZOFRAN Take 4 mg by mouth every 8 (eight) hours as needed for nausea/vomiting.        Follow-up Information    Vida Rigger, MD. Schedule an appointment as soon as possible for a visit in 1 week(s).   Specialty: Pulmonary Disease Why: post-COVID followup Contact information: [address] [place] [address] [phone]               No Known Allergies   The results of significant diagnostics from this hospitalization (including imaging, microbiology, ancillary and laboratory) are listed below for reference.   Consultations:   Procedures/Studies: DG Chest 1 View  Result Date: 09/15/2019 CLINICAL DATA:  COVID pneumonia EXAM: CHEST  1 VIEW COMPARISON:  12/12/2018 FINDINGS: There is scattered bilateral pulmonary opacities, greatest in the right mid lung zone. There is no pneumothorax. No large pleural effusion. The lung volumes are low. The cardiac silhouette is borderline enlarged. There is no acute osseous abnormality. IMPRESSION: Bilateral pulmonary opacities concerning for multifocal pneumonia (viral or bacterial). Electronically Signed   By: Katherine Mantle M.D.   On: 09/15/2019 03:44      Labs: BNP (last 3 results) Recent Labs    09/15/19 0550  BNP 30.6   Basic Metabolic Panel: Recent Labs  Lab 09/16/19 0602 09/16/19 0602 09/17/19 0705 09/18/19 0540 09/19/19 0622 09/20/19 0533 09/21/19 0512  NA 137   < > 139 137 136 138 138  K 3.7   < > 3.5 3.7 4.3 4.2 4.3  CL 100   < > 102 102 101 102 103  CO2 27   < > 25 25 24 26 27   GLUCOSE 127*   < > 118* 117* 177* 170* 165*  BUN 15   < > 17 15 17  21* 20  CREATININE 1.19   < > 1.06 0.86 0.82 0.87 0.84  CALCIUM 8.6*   < > 8.1* 8.4* 8.7* 8.9 8.6*  MG 1.9   < > 2.0 2.0 2.5* 2.3 2.4  PHOS 2.5  --   --   --   --   --   --    < > = values in this interval not displayed.   Liver Function Tests: Recent Labs  Lab 09/14/19 2259 09/16/19 0602    AST 35 47*  ALT 19 21  ALKPHOS 87 68  BILITOT 0.9 0.8  PROT 7.9 7.1  ALBUMIN 4.0 3.5   No results for input(s): LIPASE, AMYLASE in the last 168 hours. No results for input(s): AMMONIA in the last 168 hours. CBC: Recent Labs  Lab 09/14/19 2259 09/15/19 1355 09/16/19 0602 09/16/19 0602 09/17/19 0705 09/18/19 0540 09/19/19 0622 09/20/19 0533 09/21/19 0512  WBC 8.8   < > 10.1   < > 8.7 9.6 7.0 11.8* 12.0*  NEUTROABS 5.8  --  7.0  --   --   --   --   --   --   HGB 14.9   < > 13.8   < > 13.9 13.1 14.1 14.3 14.4  HCT 42.7   < > 40.1   < > 39.6 39.0 39.6 42.9 42.0  MCV 84.4   < > 85.7   < > 83.9 86.3 83.4 87.4 84.5  PLT 124*   < > 143*   < > 176 209 290 414* 442*   < > = values in this interval not displayed.   Cardiac Enzymes: No results for input(s): CKTOTAL, CKMB, CKMBINDEX, TROPONINI in the last 168 hours. BNP: Invalid input(s): POCBNP CBG: No results for input(s): GLUCAP in the last 168 hours. D-Dimer No results for input(s): DDIMER in the last 72 hours. Hgb A1c No results for input(s): HGBA1C in the last 72 hours. Lipid Profile No results for input(s): CHOL, HDL, LDLCALC, TRIG, CHOLHDL, LDLDIRECT in the last 72 hours. Thyroid function studies No results for input(s): TSH, T4TOTAL, T3FREE, THYROIDAB in the last 72 hours.  Invalid input(s): FREET3 Anemia work up No results for input(s): VITAMINB12, FOLATE, FERRITIN, TIBC, IRON, RETICCTPCT in the last 72 hours. Urinalysis    Component Value Date/Time   COLORURINE AMBER (A) 09/15/2019 1807   APPEARANCEUR HAZY (A) 09/15/2019 1807   LABSPEC 1.031 (H) 09/15/2019 1807   PHURINE 6.0 09/15/2019 1807   GLUCOSEU 50 (A) 09/15/2019 1807   HGBUR NEGATIVE 09/15/2019 1807   BILIRUBINUR NEGATIVE 09/15/2019 1807   KETONESUR 20 (A) 09/15/2019 1807   PROTEINUR 100 (A) 09/15/2019 1807   NITRITE NEGATIVE 09/15/2019 1807   LEUKOCYTESUR NEGATIVE 09/15/2019 1807   Sepsis Labs Invalid input(s): PROCALCITONIN,  WBC,   LACTICIDVEN Microbiology Recent Results (from the past 240 hour(s))  Culture, blood (Routine x 2)     Status: None   Collection Time: 09/14/19 10:59 PM   Specimen: BLOOD  Result Value Ref Range Status   Specimen Description BLOOD RIGHT ANTECUBITAL  Final   Special Requests   Final    BOTTLES DRAWN AEROBIC AND ANAEROBIC Blood Culture results may not be optimal due to an excessive volume of blood received in culture bottles   Culture   Final    NO GROWTH 5 DAYS Performed at [facility], [address] [place], [place] [address]    Report Status 09/19/2019 FINAL  Final  Culture, blood (Routine x 2)     Status: Abnormal   Collection Time: 09/15/19  5:50 AM   Specimen: BLOOD  Result Value Ref Range Status   Specimen Description   Final    BLOOD BLOOD LEFT WRIST Performed at [facility], [address] [place], [place] [address]    Special Requests   Final    BOTTLES DRAWN AEROBIC AND ANAEROBIC Blood Culture adequate volume Performed at [facility], [address] [place], [place] [address]    Culture  Setup Time   Final    GRAM POSITIVE RODS AEROBIC BOTTLE ONLY CRITICAL RESULT CALLED TO, READ BACK BY AND VERIFIED WITH: Wilkie AyeINA WANG 09/16/19 0848  Performed at [facility], [address] [place], [place] [address]    Culture (A)  Final    CORYNEBACTERIUM AMYCOLATUM Standardized susceptibility testing for this organism is not available. Performed at [facility], [address] [place], [place] [address]    Report Status 09/18/2019 FINAL  Final     Total time spend on discharging this patient,  including the last patient exam, discussing the hospital stay, instructions for ongoing care as it relates to all pertinent caregivers, as well as preparing the medical discharge records, prescriptions, and/or referrals as applicable, is 45 minutes.    Darlin Priestly, MD  [facility] 09/21/2019, 3:38 PM  If 7PM-7AM, please contact  night-coverage

## 2020-11-14 IMAGING — DX DG CHEST 1V
1 series · 1 of 1 positions shown · non-contrast
Comparison: 12/12/2018

CLINICAL DATA: COVID pneumonia

EXAM:
CHEST  1 VIEW

[chest ap]
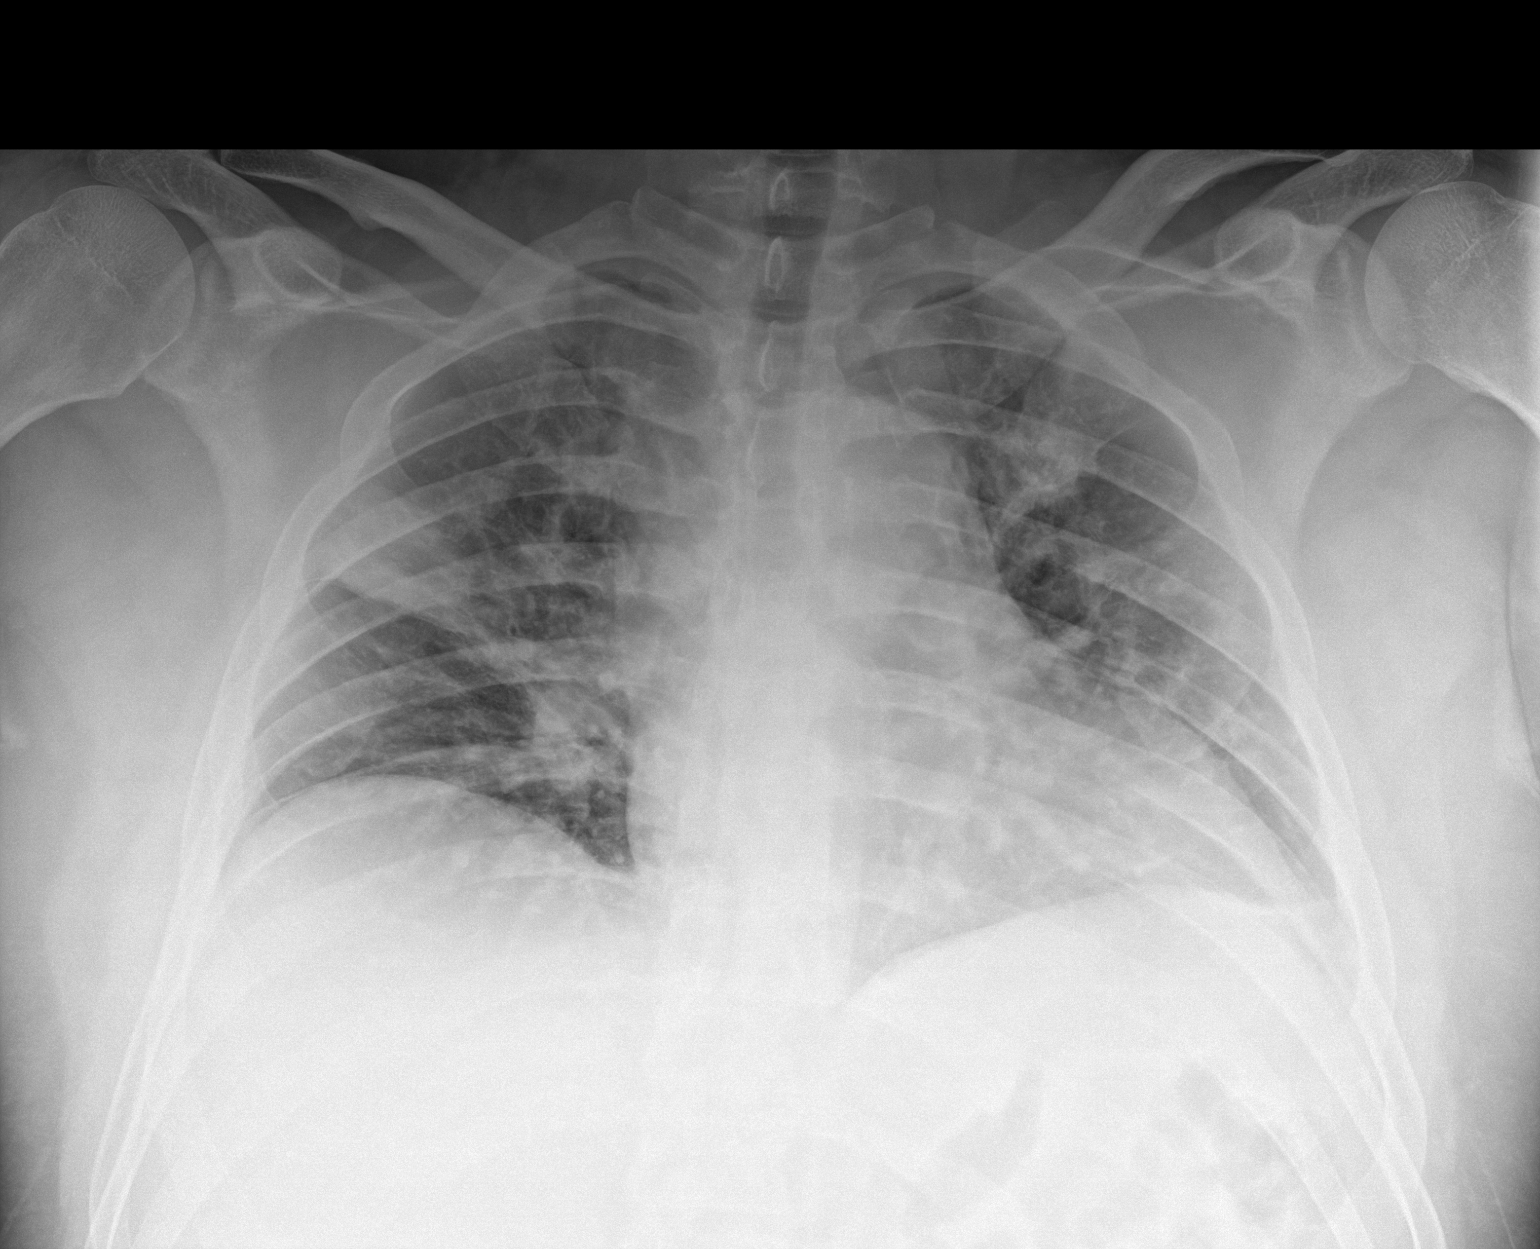

[1 of 1 positions shown; findings below may reference images not displayed]

FINDINGS: There is scattered bilateral pulmonary opacities, greatest in the
right mid lung zone. There is no pneumothorax. No large pleural
effusion. The lung volumes are low. The cardiac silhouette is
borderline enlarged. There is no acute osseous abnormality.
IMPRESSION: Bilateral pulmonary opacities concerning for multifocal pneumonia
(viral or bacterial).
